# Patient Record
Sex: Male | Born: 1945 | Race: Black or African American | Hispanic: No | Smoking: Current every day smoker
Health system: Southern US, Community
[De-identification: ages and names within clinical notes are randomized; demographics above are authoritative.]

## PROBLEM LIST (undated history)

## (undated) DIAGNOSIS — K7460 Unspecified cirrhosis of liver: Secondary | ICD-10-CM

## (undated) DIAGNOSIS — J45909 Unspecified asthma, uncomplicated: Secondary | ICD-10-CM

## (undated) DIAGNOSIS — C229 Malignant neoplasm of liver, not specified as primary or secondary: Secondary | ICD-10-CM

## (undated) DIAGNOSIS — J449 Chronic obstructive pulmonary disease, unspecified: Secondary | ICD-10-CM

## (undated) HISTORY — DX: Unspecified cirrhosis of liver: K74.60

## (undated) HISTORY — DX: Malignant neoplasm of liver, not specified as primary or secondary: C22.9

## (undated) HISTORY — PX: ANKLE SURGERY: SHX546

## (undated) HISTORY — DX: Unspecified asthma, uncomplicated: J45.909

---

## 2007-10-16 ENCOUNTER — Emergency Department: Payer: Self-pay | Admitting: Emergency Medicine

## 2008-09-02 ENCOUNTER — Observation Stay: Payer: Self-pay | Admitting: Internal Medicine

## 2008-12-01 ENCOUNTER — Inpatient Hospital Stay: Payer: Self-pay | Admitting: Internal Medicine

## 2011-01-27 ENCOUNTER — Inpatient Hospital Stay: Payer: Self-pay | Admitting: Internal Medicine

## 2013-06-30 ENCOUNTER — Emergency Department: Payer: Self-pay | Admitting: Emergency Medicine

## 2013-06-30 LAB — BASIC METABOLIC PANEL
Chloride: 105 mmol/L (ref 98–107)
Co2: 28 mmol/L (ref 21–32)
Creatinine: 1.04 mg/dL (ref 0.60–1.30)
EGFR (African American): >60
EGFR (Non-African Amer.): >60
Potassium: 4.3 mmol/L (ref 3.5–5.1)
Sodium: 137 mmol/L (ref 136–145)

## 2013-06-30 LAB — CBC
HGB: 13.4 g/dL (ref 13.0–18.0)
MCH: 35.8 pg — ABNORMAL HIGH (ref 26.0–34.0)
RDW: 14.8 % — ABNORMAL HIGH (ref 11.5–14.5)

## 2013-06-30 LAB — TROPONIN I: Troponin-I: <0.02 ng/mL

## 2013-12-19 ENCOUNTER — Inpatient Hospital Stay: Payer: Self-pay | Admitting: Internal Medicine

## 2013-12-19 LAB — CBC
HCT: 42.2 % (ref 40.0–52.0)
HGB: 13.9 g/dL (ref 13.0–18.0)
MCH: 34.1 pg — ABNORMAL HIGH (ref 26.0–34.0)
MCHC: 33 g/dL (ref 32.0–36.0)
MCV: 103 fL — AB (ref 80–100)
Platelet: 127 10*3/uL — ABNORMAL LOW (ref 150–440)
RBC: 4.08 10*6/uL — ABNORMAL LOW (ref 4.40–5.90)
RDW: 14.6 % — AB (ref 11.5–14.5)
WBC: 4.4 10*3/uL (ref 3.8–10.6)

## 2013-12-19 LAB — COMPREHENSIVE METABOLIC PANEL
ALBUMIN: 2.9 g/dL — AB (ref 3.4–5.0)
AST: 130 U/L — AB (ref 15–37)
Alkaline Phosphatase: 237 U/L — ABNORMAL HIGH
Anion Gap: 5 — ABNORMAL LOW (ref 7–16)
BUN: 13 mg/dL (ref 7–18)
Bilirubin,Total: 0.6 mg/dL (ref 0.2–1.0)
CHLORIDE: 101 mmol/L (ref 98–107)
CO2: 30 mmol/L (ref 21–32)
Calcium, Total: 8.9 mg/dL (ref 8.5–10.1)
Creatinine: 0.94 mg/dL (ref 0.60–1.30)
EGFR (African American): >60
Glucose: 98 mg/dL (ref 65–99)
Osmolality: 272 (ref 275–301)
POTASSIUM: 4.7 mmol/L (ref 3.5–5.1)
SGPT (ALT): 110 U/L — ABNORMAL HIGH (ref 12–78)
Sodium: 136 mmol/L (ref 136–145)
Total Protein: 8.1 g/dL (ref 6.4–8.2)

## 2013-12-19 LAB — DRUG SCREEN, URINE
Amphetamines, Ur Screen: NEGATIVE (ref ?–1000)
Barbiturates, Ur Screen: NEGATIVE (ref ?–200)
Benzodiazepine, Ur Scrn: NEGATIVE (ref ?–200)
CANNABINOID 50 NG, UR ~~LOC~~: NEGATIVE (ref ?–50)
COCAINE METABOLITE, UR ~~LOC~~: POSITIVE (ref ?–300)
MDMA (Ecstasy)Ur Screen: NEGATIVE (ref ?–500)
METHADONE, UR SCREEN: NEGATIVE (ref ?–300)
Opiate, Ur Screen: NEGATIVE (ref ?–300)
Phencyclidine (PCP) Ur S: NEGATIVE (ref ?–25)
Tricyclic, Ur Screen: NEGATIVE (ref ?–1000)

## 2013-12-19 LAB — D-DIMER(ARMC): D-DIMER: 318 ng/mL

## 2013-12-19 LAB — ETHANOL
Ethanol %: <0.003 % (ref 0.000–0.080)
Ethanol: <3 mg/dL

## 2013-12-19 LAB — PRO B NATRIURETIC PEPTIDE: B-Type Natriuretic Peptide: 114 pg/mL (ref 0–125)

## 2013-12-19 LAB — TROPONIN I

## 2013-12-20 LAB — CBC WITH DIFFERENTIAL/PLATELET
BASOS PCT: 0.2 %
Basophil #: 0 10*3/uL (ref 0.0–0.1)
EOS ABS: 0 10*3/uL (ref 0.0–0.7)
Eosinophil %: 0 %
HCT: 38.1 % — ABNORMAL LOW (ref 40.0–52.0)
HGB: 12.7 g/dL — ABNORMAL LOW (ref 13.0–18.0)
Lymphocyte #: 1 10*3/uL (ref 1.0–3.6)
Lymphocyte %: 14.7 %
MCH: 34.7 pg — ABNORMAL HIGH (ref 26.0–34.0)
MCHC: 33.5 g/dL (ref 32.0–36.0)
MCV: 104 fL — ABNORMAL HIGH (ref 80–100)
MONO ABS: 0.1 x10 3/mm — AB (ref 0.2–1.0)
Monocyte %: 1.2 %
NEUTROS PCT: 83.9 %
Neutrophil #: 5.4 10*3/uL (ref 1.4–6.5)
Platelet: 126 10*3/uL — ABNORMAL LOW (ref 150–440)
RBC: 3.67 10*6/uL — ABNORMAL LOW (ref 4.40–5.90)
RDW: 14.4 % (ref 11.5–14.5)
WBC: 6.5 10*3/uL (ref 3.8–10.6)

## 2013-12-20 LAB — BASIC METABOLIC PANEL
Anion Gap: 5 — ABNORMAL LOW (ref 7–16)
BUN: 20 mg/dL — ABNORMAL HIGH (ref 7–18)
CO2: 27 mmol/L (ref 21–32)
CREATININE: 1.02 mg/dL (ref 0.60–1.30)
Calcium, Total: 8.5 mg/dL (ref 8.5–10.1)
Chloride: 100 mmol/L (ref 98–107)
EGFR (African American): >60
Glucose: 145 mg/dL — ABNORMAL HIGH (ref 65–99)
OSMOLALITY: 270 (ref 275–301)
Potassium: 4.2 mmol/L (ref 3.5–5.1)
Sodium: 132 mmol/L — ABNORMAL LOW (ref 136–145)

## 2013-12-20 LAB — HEPATIC FUNCTION PANEL A (ARMC)
Albumin: 2.8 g/dL — ABNORMAL LOW (ref 3.4–5.0)
Alkaline Phosphatase: 188 U/L — ABNORMAL HIGH
Bilirubin, Direct: 0.3 mg/dL — ABNORMAL HIGH (ref 0.00–0.20)
Bilirubin,Total: 0.5 mg/dL (ref 0.2–1.0)
SGOT(AST): 110 U/L — ABNORMAL HIGH (ref 15–37)
SGPT (ALT): 106 U/L — ABNORMAL HIGH (ref 12–78)
Total Protein: 7.6 g/dL (ref 6.4–8.2)

## 2014-06-09 ENCOUNTER — Emergency Department: Payer: Self-pay | Admitting: Emergency Medicine

## 2014-06-09 LAB — CBC
HCT: 42.4 % (ref 40.0–52.0)
HGB: 13.9 g/dL (ref 13.0–18.0)
MCH: 33.9 pg (ref 26.0–34.0)
MCHC: 32.8 g/dL (ref 32.0–36.0)
MCV: 103 fL — AB (ref 80–100)
Platelet: 105 10*3/uL — ABNORMAL LOW (ref 150–440)
RBC: 4.1 10*6/uL — ABNORMAL LOW (ref 4.40–5.90)
RDW: 13.5 % (ref 11.5–14.5)
WBC: 4.6 10*3/uL (ref 3.8–10.6)

## 2014-06-09 LAB — COMPREHENSIVE METABOLIC PANEL
ALBUMIN: 2.9 g/dL — AB (ref 3.4–5.0)
ALK PHOS: 152 U/L — AB
Anion Gap: 7 (ref 7–16)
BILIRUBIN TOTAL: 1.2 mg/dL — AB (ref 0.2–1.0)
BUN: 13 mg/dL (ref 7–18)
CALCIUM: 8.2 mg/dL — AB (ref 8.5–10.1)
CHLORIDE: 102 mmol/L (ref 98–107)
CREATININE: 0.99 mg/dL (ref 0.60–1.30)
Co2: 25 mmol/L (ref 21–32)
EGFR (Non-African Amer.): >60
GLUCOSE: 114 mg/dL — AB (ref 65–99)
Osmolality: 269 (ref 275–301)
POTASSIUM: 4 mmol/L (ref 3.5–5.1)
SGOT(AST): 98 U/L — ABNORMAL HIGH (ref 15–37)
SGPT (ALT): 109 U/L — ABNORMAL HIGH
SODIUM: 134 mmol/L — AB (ref 136–145)
TOTAL PROTEIN: 7.5 g/dL (ref 6.4–8.2)

## 2014-06-09 LAB — TROPONIN I: Troponin-I: <0.02 ng/mL

## 2014-06-09 LAB — LIPASE, BLOOD: Lipase: 182 U/L (ref 73–393)

## 2015-01-06 NOTE — H&P (Signed)
PATIENT NAME:  Richard Wood, Richard Wood MR#:  [id number] DATE OF BIRTH:  Apr 16, 1946  DATE OF ADMISSION:  12/19/2013  PRIMARY CARE PHYSICIAN: Lorelee Market, MD  CHIEF COMPLAINT: Shortness of breath.   HISTORY OF PRESENT ILLNESS: This is a 69 year old male who presents to the hospital due to worsening shortness of breath and cough for the past 4 to 5 days. The patient says his shortness of breath has gotten so progressively worse that he has it even on rest now. He admits to a cough, but it is not that productive. Admits to chills but no documented fever. He denies any nausea, vomiting, abdominal pain, chest pain or any other associated symptoms. The patient said he saw Dr. Brunetta Genera last week who prescribed him an oral antibiotic. He has been taking but his symptoms have not improved. He presented to the hospital, was noted to have diffuse wheezing and shortness of breath and noted to be in COPD exacerbation. Hospitalist services were contacted for further treatment and evaluation.   REVIEW OF SYSTEMS:  CONSTITUTIONAL: No documented fever. No weight gain. No weight loss.  EYES: No blurry or double vision.  ENT: No tinnitus or postnasal drip. No redness of the oropharynx.  RESPIRATORY: Positive cough. Positive wheeze. No hemoptysis. Positive dyspnea and COPD.  CARDIOVASCULAR: No chest pain, no orthopnea, no palpitations, and no syncope.  GASTROINTESTINAL: No nausea, no vomiting, no diarrhea, no abdominal pain, no melena or hematochezia.  GENITOURINARY: No dysuria or hematuria.  ENDOCRINE: No polyuria or nocturia. No heat or cold intolerance.  HEMATOLOGIC: No anemia, no bruising, and no bleeding.  INTEGUMENTARY: No rashes and no lesions.  MUSCULOSKELETAL: No arthritis, no swelling, and no gout.  NEUROLOGIC: No numbness or tingling. No ataxia. No seizure-type activity.  PSYCHIATRIC: Positive anxiety. No insomnia. No ADD.   PAST MEDICAL HISTORY: Consistent with COPD, hepatitis C, tobacco abuse, cocaine  abuse, and anxiety.   ALLERGIES: No known drug allergies.   SOCIAL HISTORY: Used to be a smoker. Does have a 30 to 40 pack-year smoking history, quit 3 weeks ago. Also, he used to drink a 6 pack weekly, quit about 3 weeks ago. He also did cocaine about a month ago.   FAMILY HISTORY: Mother and father are both deceased. Mother died from cancer, likely pancreatic or colon cancer. Father died from old age.   CURRENT MEDICATIONS: Albuterol, DuoNebs q. 6 hours, albuterol inhaler 2 puffs 4 times daily as needed, Xanax 0.5 mg b.i.d., amoxicillin 875 mg b.i.d., formoterol 12 mcg inhalation capsule b.i.d., mometasone spray 1 puff daily, Ambien 5 mg at bedtime.   PHYSICAL EXAMINATION:  VITAL SIGNS: Presently, temperature is 98.4, pulse 96, respirations 18, blood pressure 157/98 and sats 99% on room air.  GENERAL: He is a pleasant-appearing male in mild respiratory distress. HEAD, EYES, NOSE, AND THROAT: He is atraumatic, normocephalic. Extraocular muscles are intact. Pupils are equal and reactive to light. Sclerae anicteric. No conjunctival injection. No pharyngeal erythema.  NECK: Supple. There is no jugular venous distention. No bruits, no lymphadenopathy and no thyromegaly.  HEART: Regular rate and rhythm. No murmurs. No rubs. No clicks.  LUNGS: Diffuse wheezing end-expiratory bilaterally. Negative use of accessory muscles. No dullness to percussion.  ABDOMEN: Soft, flat, nontender, nondistended. Has good bowel sounds. No hepatosplenomegaly appreciated.  EXTREMITIES: No evidence of any cyanosis, clubbing, or peripheral edema. Has +2 pedal and radial pulses bilaterally. NEUROLOGIC: The patient is alert, awake, and oriented x3 with no focal motor or sensory deficits appreciated bilaterally.  SKIN: Moist and  warm with no rashes appreciated.  LYMPHATIC: There is no cervical or axillary lymphadenopathy.   DIAGNOSTIC DATA: Laboratory: Serum glucose 98, BUN 13, creatinine 0.9, sodium 136, potassium 4.7,  chloride 101, bicarb 30. the patient's LFTs showed alk phos of 237, AST 130, ALT 110. Troponin less than 0.02. White cell count is 4.4, hemoglobin 13.9, hematocrit 42.2, platelet count 127,000.   The patient did have a chest x-ray done which showed COPD with no acute cardiopulmonary disease.   ASSESSMENT AND PLAN: This is a 69 year old male with a history of chronic obstructive pulmonary disease, hepatitis C, tobacco and cocaine abuse, and anxiety who presents to the hospital with shortness of breath, cough, progressively getting worse and noted to be in chronic obstructive pulmonary disease exacerbation. 1.  Chronic obstructive pulmonary disease exacerbation. This is likely secondary to possible underlying acute bronchitis. There is no evidence of pneumonia on chest x-ray. The patient quit smoking 3 weeks ago. He has failed outpatient therapy with oral antibiotics. I will start the patient on IV steroids, around-the-clock nebulizer treatments, add some Advair and Spiriva and follow. I will also start him on empiric Z-Pak for now, assess him for home oxygen prior to discharge.  2.  Abnormal liver function tests. This is likely secondary to his underlying hepatitis C. We will follow his liver function tests. 3.  Thrombocytopenia. This is mild, also possibly related to underlying hepatitis C. No evidence of acute bleeding. Follow platelet count. Hold heparin products.  4.  Anxiety. Continue Xanax.  5.  Polysubstance abuse. I will check a urine drug screen.   CODE STATUS: FULL.  TIME SPENT: 50 minutes.  ____________________________ Belia Heman. Verdell Carmine, MD vjs:[id number] D: 12/19/2013 13:40:32 ET T: 12/19/2013 14:08:56 ET JOB#: [id number]  cc: Belia Heman. Verdell Carmine, MD, <Dictator> Henreitta Leber MD ELECTRONICALLY SIGNED 12/25/2013 18:48

## 2015-01-06 NOTE — Discharge Summary (Signed)
PATIENT NAME:  Richard Wood, Richard Wood MR#:  [id number] DATE OF BIRTH:  1946/05/19  DATE OF ADMISSION:  12/19/2013 DATE OF DISCHARGE:  12/21/2013  ADMITTING PHYSICIAN: Belia Heman. Verdell Carmine, MD  DISCHARGING PHYSICIAN: Gladstone Lighter, MD  PRIMARY CARE PHYSICIAN: Meindert A. Brunetta Genera, MD  DISCHARGE DIAGNOSES: 1.  Acute on chronic, chronic obstructive pulmonary disease exacerbation.  2.  Anxiety.  3.  Mild thrombocytopenia.  4.  Hepatitis C and liver cirrhosis.   DISCHARGE HOME MEDICATIONS:  1.  Albuterol nebulizer 3 mL every 6 hours p.r.n. for wheezing.  2.  Albuterol inhaler 2 puffs q.6 hours p.r.n.  3.  Mometasone 220 mcg per inhalation 1 puff once a day.  4.  Ambien 5 mg p.o. at bedtime.  5.  Formoterol 12 mcg 1 capsule inhalation every 12 hours.  6.  Xanax 0.5 mg p.o. b.i.d.  7.  Prednisone taper over 12 days. 8.  Spiriva inhalation capsule daily. 9.  Robitussin AC 5 mL q.6 hours p.r.n. for cough.  10.  Azithromycin 250 mg p.o. daily for 3 more days.   DISCHARGE HOME OXYGEN: None.   DISCHARGE DIET: Low-sodium diet.   DISCHARGE ACTIVITY: As tolerated.    FOLLOWUP INSTRUCTIONS: PCP followup in 1 to 2 weeks.   LABORATORY AND IMAGING STUDIES PRIOR TO DISCHARGE: WBC count is 6.5, hemoglobin 12.7, hematocrit 38.1; platelet count is 126.   Sodium 132, potassium 4.2, chloride 100, bicarbonate 27, BUN 20, creatinine 1.02, glucose 145, and calcium of 8.5.   ALT 106, AST 110, alkaline phosphatase 188, total bilirubin 0.5, and albumin of 3.8. Urine tox screen positive for cocaine. Troponins negative in the hospital through the hospital stay. Chest x-ray on admission showing no acute cardiopulmonary disease, evidence of COPD seen.   BRIEF HOSPITAL COURSE: Mr. Vayda is a 69 year old African American male with past medical history significant for COPD, not on any home oxygen, smoker and takes cocaine once in a while. Presented to the hospital secondary to difficulty breathing and noted to have COPD  exacerbation with mild bronchitis.  1.  Acute on chronic COPD exacerbation with bronchitis: Started on steroids. Nebulizers and inhalers were continued and also Z-Pak. Symptoms improved. He was on oxygen when he came in. His chest x-ray was clear. Now he is on room air, ambulating well. Breathing is controlled. He is being discharged on p.o. prednisone and Z-Pak continuation. 2.  Hepatitis C and liver cirrhosis with abnormal liver function tests, worsened on admission; however, repeat tests have shown some improvement. He also has chronic thrombocytopenia secondary to the same and will need outpatient monitoring.  3.  Anxiety: Xanax was continued.   His course has been otherwise uneventful in the hospital.   DISCHARGE CONDITION: Stable.   DISCHARGE DISPOSITION: Home.   TIME SPENT ON DISCHARGE: 40 minutes.    ____________________________ Gladstone Lighter, MD rk:jcm D: 12/21/2013 14:02:38 ET T: 12/21/2013 14:48:56 ET JOB#: [id number]  cc: Gladstone Lighter, MD, <Dictator> Meindert A. Brunetta Genera, MD Gladstone Lighter MD ELECTRONICALLY SIGNED 01/03/2014 11:19

## 2015-11-06 DIAGNOSIS — C220 Liver cell carcinoma: Secondary | ICD-10-CM | POA: Insufficient documentation

## 2015-11-14 HISTORY — PX: LIVER SURGERY: SHX698

## 2016-08-04 ENCOUNTER — Encounter: Payer: Self-pay | Admitting: Emergency Medicine

## 2016-08-04 ENCOUNTER — Emergency Department
Admission: EM | Admit: 2016-08-04 | Discharge: 2016-08-04 | Disposition: A | Discharge status: Home / Self Care | Payer: Medicare Other | Attending: Emergency Medicine | Admitting: Emergency Medicine

## 2016-08-04 DIAGNOSIS — K4091 Unilateral inguinal hernia, without obstruction or gangrene, recurrent: Secondary | ICD-10-CM | POA: Diagnosis not present

## 2016-08-04 DIAGNOSIS — R1032 Left lower quadrant pain: Secondary | ICD-10-CM | POA: Diagnosis present

## 2016-08-04 DIAGNOSIS — J449 Chronic obstructive pulmonary disease, unspecified: Secondary | ICD-10-CM | POA: Diagnosis not present

## 2016-08-04 HISTORY — DX: Chronic obstructive pulmonary disease, unspecified: J44.9

## 2016-08-04 MED ORDER — OXYCODONE HCL 5 MG PO TABS: 5.0000 mg | ORAL_TABLET | Freq: Three times a day (TID) | ORAL | 0 refills | Status: DC | PRN

## 2016-08-04 NOTE — Discharge Instructions (Signed)
As we discussed please call the number provider to arrange a follow-up appointment with general surgery since possible. Take your pain medication as needed, as prescribed. If the hernia/swelling returns and you're not able to push it back in or it becomes painful or you develop a fever or you need to come back to the emergency department immediately for evaluation. Otherwise follow up with general surgery.

## 2016-08-04 NOTE — ED Triage Notes (Addendum)
Pt reports intermittent left side groin pain and swelling for approximately one month. Pt denies NVD. Pt denies dysuria or difficulty voiding. Pt in no apparent distress in triage. Ambulated without difficulty.

## 2016-08-04 NOTE — ED Provider Notes (Addendum)
[facility] Emergency Department Provider Note  Time seen: 3:09 PM  I have reviewed the triage vital signs and the nursing notes.   HISTORY  Chief Complaint Groin Pain    HPI Richard Wood is a 70 y.o. male with a past medical history of COPD who presents the emergency department with left groin pain. According to the patientfor the past several months he has been experiencing swelling to the left groin which occurs intermittently. States he can push on it causes swelling to go back down. He states this morning he was having a lot of pain with the swelling so he came to the emergency department. However once arriving in the emergency department he noted that the swelling had gone back down and his pain was once again gone. Denies any constipation, states daily bowel movements including today. Denies abdominal pain at this point. Denies fever. Denies dysuria or hematuria. Denies nausea or vomiting.  Past Medical History:  Diagnosis Date  . COPD (chronic obstructive pulmonary disease) (HCC)     There are no active problems to display for this patient.   No past surgical history on file.  Prior to Admission medications   Not on File    Allergies  Allergen Reactions  . Influenza Vaccine Recombinant Hives    No family history on file.  Social History Social History  Substance Use Topics  . Smoking status: Not on file  . Smokeless tobacco: Not on file  . Alcohol use Not on file    Review of Systems Constitutional: Negative for fever. Cardiovascular: Negative for chest pain. Respiratory: Negative for shortness of breath. Gastrointestinal: Intermittent left groin pain Genitourinary: Negative for dysuria. 10-point ROS otherwise negative.  ____________________________________________   PHYSICAL EXAM:  VITAL SIGNS: ED Triage Vitals [08/04/16 1146]  Enc Vitals Group     BP 126/70     Pulse Rate 89     Resp 18     Temp 97.8 F (36.6 C)   Temp Source Oral     SpO2 99 %     Weight 120 lb (54.4 kg)     Height 5\' 10"  (1.778 m)     Head Circumference      Peak Flow      Pain Score 8     Pain Loc      Pain Edu?      Excl. in [place]?     Constitutional: Alert and oriented. Well appearing and in no distress. Eyes: Normal exam ENT   Head: Normocephalic and atraumatic.   Mouth/Throat: Mucous membranes are moist. Cardiovascular: Normal rate, regular rhythm. No murmur Respiratory: Normal respiratory effort without tachypnea nor retractions. Breath sounds are clear Gastrointestinal: Soft, no tenderness. No distention. No mass. Normal GU exam. Nontender testicles. No obvious hernia at this time. Musculoskeletal: Nontender with normal range of motion in all extremities. Neurologic:  Normal speech and language. No gross focal neurologic deficits  Skin:  Skin is warm, dry and intact.  Psychiatric: Mood and affect are normal. Speech and behavior are normal.   ____________________________________________   INITIAL IMPRESSION / ASSESSMENT AND PLAN / ED COURSE  Pertinent labs & imaging results that were available during my care of the patient were reviewed by me and considered in my medical decision making (see chart for details).  The patient presents the emergency department with intermittent left groin pain over the past 1-2 months. He states he will occasionally have pain and swelling in the left groin. States he is  able to lie down flat or push on the area and the swelling and pain will go away. Patient's symptoms are very consistent with an inguinal hernia. The location of the discomfort is just over his inguinal canal. Highly suspect intermittent hernia. However the patient is currently pain-free with a normal GU exam. No other concerning associated findings. I discussed with the patient follow-up with Gen. surgery clinic. We'll discharge with short course of pain medication to be used as needed. I also discussed return  precautions for pain over the hernia, or not being able to push the hernia back in. Patient is agreeable plan.  ____________________________________________   FINAL CLINICAL IMPRESSION(S) / ED DIAGNOSES  Inguinal hernia    Harvest Dark, MD 08/04/16 1512    Harvest Dark, MD 08/04/16 [phone]

## 2016-08-15 ENCOUNTER — Ambulatory Visit: Payer: Medicare Other | Admitting: Surgery

## 2016-08-28 ENCOUNTER — Other Ambulatory Visit
Admission: RE | Admit: 2016-08-28 | Discharge: 2016-08-28 | Disposition: A | Discharge status: Home / Self Care | Payer: Medicare Other | Source: Ambulatory Visit | Attending: Surgery | Admitting: Surgery

## 2016-08-28 ENCOUNTER — Ambulatory Visit (INDEPENDENT_AMBULATORY_CARE_PROVIDER_SITE_OTHER): Payer: Medicare Other | Admitting: Surgery

## 2016-08-28 ENCOUNTER — Telehealth: Payer: Self-pay

## 2016-08-28 ENCOUNTER — Encounter: Payer: Self-pay | Admitting: Surgery

## 2016-08-28 VITALS — BP 138/87 | HR 86 | Temp 98.5°F | Ht 70.0 in | Wt 117.2 lb

## 2016-08-28 DIAGNOSIS — K4090 Unilateral inguinal hernia, without obstruction or gangrene, not specified as recurrent: Secondary | ICD-10-CM | POA: Diagnosis not present

## 2016-08-28 DIAGNOSIS — Z01818 Encounter for other preprocedural examination: Secondary | ICD-10-CM

## 2016-08-28 DIAGNOSIS — C220 Liver cell carcinoma: Secondary | ICD-10-CM | POA: Diagnosis not present

## 2016-08-28 MED ORDER — GABAPENTIN 300 MG PO CAPS: 300.0000 mg | ORAL_CAPSULE | Freq: Three times a day (TID) | ORAL | 0 refills | Status: DC

## 2016-08-28 NOTE — Progress Notes (Signed)
Patient ID: Richard Wood, male   DOB: 09-Mar-1946, 70 y.o.   MRN: [id number]  HPI Richard Wood is a 70 y.o. male seen in consultation for a left inguinal hernia. He reports that he does have excruciating pain on the left inguinal region for the last 3 months. Pain is intermittent and is getting worse and is to the point of being excruciating. He has still bulging sensation that he is able to reduce. He discussed the pain as severe, sharp and worsening with some Valsalva maneuvers. Of note he does have a history of hepatocellular carcinoma and usually goes to the [place]. Unfortunately not all the records are available but looking at [place] looks like he did have an embolization with radioactive material performed at [facility]. The patient reports that he has not had any follow-up at the [facility]. And there is a questionable cirrhosis and. He did use alcohol but the last time he drank was about a year ago. He does use some cocaine MR 10 occasionally. Last time he had any illegal substance abuse was about 2 weeks ago. He is able to walk without difficulty and no shortness of breath or chest pain. No evidence of overt cirrhosis. I have reviewed the ultrasound personally that was done 2015 and there was no evidence of cirrhosis or liver masses. He did have some sludge in the common bile duct and GB.  HPI  Past Medical History:  Diagnosis Date  . Asthma   . Cirrhosis ([place])   . COPD (chronic obstructive pulmonary disease) ([place])   . Liver cancer [facility])     Past Surgical History:  Procedure Laterality Date  . ANKLE SURGERY Left 1980's  . LIVER SURGERY  11/2015   [facility]    Family History  Problem Relation Age of Onset  . Heart disease Father     Social History Social History  Substance Use Topics  . Smoking status: Current Every Day Smoker    Packs/day: 0.25    Types: Cigarettes  . Smokeless tobacco: Never Used  . Alcohol use No     Comment: Last Drink- 09/2015 - Prior Heavy ETOH  Use    Allergies  Allergen Reactions  . Influenza Vaccine Recombinant Hives  . Other Hives    Current Outpatient Prescriptions  Medication Sig Dispense Refill  . ibuprofen (ADVIL,MOTRIN) 200 MG tablet Take 200 mg by mouth every 6 (six) hours as needed.    . Naproxen Sodium (ALEVE) 220 MG CAPS Take 2 capsules by mouth every 8 (eight) hours as needed.    . gabapentin (NEURONTIN) 300 MG capsule Take 1 capsule (300 mg total) by mouth 3 (three) times daily. 90 capsule 0   No current facility-administered medications for this visit.      Review of Systems A 10 point review of systems was asked and was negative except for the information on the HPI  Physical Exam Blood pressure 138/87, pulse 86, temperature 98.5 F (36.9 C), temperature source Oral, height 5\' 10"  (1.778 m), weight 53.2 kg (117 lb 3.2 oz). CONSTITUTIONAL: NAD EYES: Pupils are equal, round, and reactive to light, Sclera are non-icteric. EARS, NOSE, MOUTH AND THROAT: The oropharynx is clear. The oral mucosa is pink and moist. Hearing is intact to voice. LYMPH NODES:  Lymph nodes in the neck are normal. RESPIRATORY:  Lungs are clear. There is normal respiratory effort, with equal breath sounds bilaterally, and without pathologic use of accessory muscles. CARDIOVASCULAR: Heart is regular without murmurs, gallops, or  rubs. GI: The abdomen is  soft, no evidence of ascites or caput medusa, there is a reducible but tender left inguinal hernia. There is no peritonitis. No evidence on physical exam of cirrhosis   GU: Rectal deferred.   MUSCULOSKELETAL: Normal muscle strength and tone. No cyanosis or edema.   SKIN: Turgor is good and there are no pathologic skin lesions or ulcers. NEUROLOGIC: Motor and sensation is grossly normal. Cranial nerves are grossly intact. PSYCH:  Oriented to person, place and time. Affect is normal.  Data Reviewed I have personally reviewed the patient's imaging, laboratory findings and medical  records.    Assessment Plan 70 year old male with a symptomatic left inguinal hernia on a patient with a history of hepatocellular carcinoma with recent embolization of radiation seeds. Unfortunately I do not have any base and about his liver function and about the status of his hepatocellular carcinoma. He goes to the [place] and will have to either obtain records from the [place]. In the meantime we will go ahead and assess his liver function with a CMP, coags and an ultrasound of the right upper quadrant to see if there is any ascites or cirrhosis. And no need for emergent surgical intervention as I stated before we have to first-degree out what is the status of his liver function before we commit for any surgical intervention. Discussed with the patient in detail. I will help him with gabapentin for his irritation of the ilioinguinal nerve. I didn't talk to him about not using narcotics at this stage. We will see him in a couple weeks once we have a better idea of his liver function. Extensive counseling provided  Caroleen Hamman, MD FACS General Surgeon 08/28/2016, 10:07 AM

## 2016-08-28 NOTE — Telephone Encounter (Signed)
Spoke with patient at this time regarding a message left for him to call our office. Patient was told of the Ultrasound appointment on 09/04/16 at 8:15 am. He stated he did not go for lab work today due to having to pick up his grandchild. We discussed having lab work done tomorrow and he said he would do so. Appointment reminder is mailed to patient at this time.

## 2016-08-28 NOTE — Patient Instructions (Signed)
We will call the [facility] and get you and appointment with the Cancer doctor there for evaluation. This HAS to be done prior to doing your surgery.  We will get labs drawn today. Go over to the medical mall at the hospital to have these done. I will call you with the results.  Directions to Medical Mall: When leaving our office, go right. Go all of the way down to the very end of the hallway. You will have a purple wall in front of you. You will now have a tunnel to the hospital on your left hand side. Go through this tunnel and the elevators will be on your left. Go down to the 1st floor and take a slight left. The very first desk on the right hand side is the registration desk.  Please pick up your pain medication at your [facility].  We have scheduled you for an Ultrasound of your Abdomen. We will call you with the details of this appointment as soon as this is scheduled. Bring a list of medications with you to your appointment. Nothing to eat or drink after midnight prior to your Ultrasound.  If you need to reschedule your Ultrasound, you may do so by calling [phone].

## 2016-09-01 ENCOUNTER — Telehealth: Payer: Self-pay

## 2016-09-01 NOTE — Telephone Encounter (Signed)
Asked Angie to please schedule an appointment for patient to see his Oncologist. Janace Hoard was told that patient has an appointment scheduled to be seen by his Oncologist on 09/23/2016. Patient will see Dr. Dahlia Byes on 10/01/2016 to discuss about his Oncologist appointment and possible scheduling surgery.  Angie is requesting patient's records from his Oncologist for Dr. Dahlia Byes to review.

## 2016-09-04 ENCOUNTER — Ambulatory Visit: Payer: Medicare Other

## 2016-09-17 ENCOUNTER — Ambulatory Visit
Admission: RE | Admit: 2016-09-17 | Discharge: 2016-09-17 | Disposition: A | Discharge status: Home / Self Care | Payer: Medicare Other | Source: Ambulatory Visit | Attending: Surgery | Admitting: Surgery

## 2016-09-17 DIAGNOSIS — K8020 Calculus of gallbladder without cholecystitis without obstruction: Secondary | ICD-10-CM | POA: Diagnosis not present

## 2016-09-17 DIAGNOSIS — K7460 Unspecified cirrhosis of liver: Secondary | ICD-10-CM | POA: Diagnosis present

## 2016-09-17 DIAGNOSIS — C220 Liver cell carcinoma: Secondary | ICD-10-CM | POA: Diagnosis not present

## 2016-09-17 DIAGNOSIS — I700 Atherosclerosis of aorta: Secondary | ICD-10-CM | POA: Insufficient documentation

## 2016-09-22 ENCOUNTER — Telehealth: Payer: Self-pay

## 2016-09-22 NOTE — Telephone Encounter (Signed)
Patient is calling to get his ultrasound results. He is very uncomfortable right now and his stomach is really bloated. Please call patient and advice.

## 2016-09-22 NOTE — Telephone Encounter (Signed)
Called patient on both numbers listed. The mobile number is missing. The home number continues to ring. Unable to leave a message at either number.  If patient returns phone call, the results of Ultrasound are complicated and will be reviewed with patient at 10/01/16 with Dr. Dahlia Byes.

## 2016-09-23 ENCOUNTER — Telehealth: Payer: Self-pay | Admitting: Surgery

## 2016-09-23 NOTE — Telephone Encounter (Signed)
Patient has called back for results of Ultrasound. I have advised the patient that the results will be discussed at his upcoming appointment with Dr Dahlia Byes on 10/01/16. Patient was understanding of this.   Patient complains of severe abdominal pain and bloating since 08/04/16.  He has been taking Aleve and Motrin daily. The gabapentin causes the patient to have nightmares.   Please advise patient at [phone].

## 2016-09-23 NOTE — Telephone Encounter (Signed)
Patient returned phone call and states that he is having severe abdominal pain and cramping. His last Bowel Movement was on 09/21/16 and was very small. Does not remember when last good bowel movement was and is having to strain significantly to move his bowels. Instructed patient to drink 1 entire bottle of Magnesium Citrate and if no bowel movement in 4 hours to do 2 fleets enemas back to back. Patient wrote down all instructions and will go to [facility] at this time to get medications. I instructed him to stop use of all NSAIDS. He verbalizes understanding and will call back tomorrow if he has not had a bowel movement or is not feeling any better.

## 2016-09-23 NOTE — Telephone Encounter (Signed)
I have returned phone call to patient. Home number continues to ring and mobile number is disconnected. Would be glad to speak with patient if patient returns phone call.

## 2016-09-25 ENCOUNTER — Telehealth: Payer: Self-pay

## 2016-09-25 NOTE — Telephone Encounter (Signed)
Patient is still bloated. He said his hernia has been sticking out and is in a lot of pain. Please call patient and advice. (Previous telephone notes are in the encounter)

## 2016-09-25 NOTE — Telephone Encounter (Signed)
Returned phone call to patient at this time. He states that he is having to strain with bowel movements which is making hernia bulge out more causing more pain. I explained to patient that he needs to be able to have bowel movements without straining to decrease the stress on this hernia. He may use Miralax 17g 1-2 times daily to help with this process. He verbalizes understanding of this and will call back if this does not help.  Also, he states that he is still losing weight and is confused over his upcoming appointments at the [place]. He does not know who he is seeing and why. He is to see an Materials engineer at the [place] in regards to his Hepatocellular Carcinoma but I do not know when this is. I will get to the bottom of this and return phone call to patient with this information.

## 2016-10-01 ENCOUNTER — Ambulatory Visit: Payer: Medicare Other | Admitting: Surgery

## 2016-10-03 ENCOUNTER — Ambulatory Visit (INDEPENDENT_AMBULATORY_CARE_PROVIDER_SITE_OTHER): Payer: Medicare Other | Admitting: Surgery

## 2016-10-03 ENCOUNTER — Other Ambulatory Visit
Admission: RE | Admit: 2016-10-03 | Discharge: 2016-10-03 | Disposition: A | Discharge status: Home / Self Care | Payer: Medicare Other | Source: Ambulatory Visit | Attending: Surgery | Admitting: Surgery

## 2016-10-03 ENCOUNTER — Encounter: Payer: Self-pay | Admitting: Surgery

## 2016-10-03 VITALS — BP 167/113 | HR 94 | Temp 97.8°F | Ht 70.0 in | Wt 122.0 lb

## 2016-10-03 DIAGNOSIS — K745 Biliary cirrhosis, unspecified: Secondary | ICD-10-CM | POA: Diagnosis not present

## 2016-10-03 DIAGNOSIS — C220 Liver cell carcinoma: Secondary | ICD-10-CM | POA: Diagnosis present

## 2016-10-03 LAB — CBC WITH DIFFERENTIAL/PLATELET
BASOS PCT: 1 %
Basophils Absolute: 0 10*3/uL (ref 0–0.1)
EOS ABS: 0.1 10*3/uL (ref 0–0.7)
Eosinophils Relative: 4 %
HCT: 36.7 % — ABNORMAL LOW (ref 40.0–52.0)
HEMOGLOBIN: 12.6 g/dL — AB (ref 13.0–18.0)
Lymphocytes Relative: 18 %
Lymphs Abs: 0.4 10*3/uL — ABNORMAL LOW (ref 1.0–3.6)
MCH: 36.6 pg — ABNORMAL HIGH (ref 26.0–34.0)
MCHC: 34.2 g/dL (ref 32.0–36.0)
MCV: 107 fL — ABNORMAL HIGH (ref 80.0–100.0)
Monocytes Absolute: 0.3 10*3/uL (ref 0.2–1.0)
Monocytes Relative: 13 %
NEUTROS PCT: 64 %
Neutro Abs: 1.6 10*3/uL (ref 1.4–6.5)
Platelets: 101 10*3/uL — ABNORMAL LOW (ref 150–440)
RBC: 3.43 MIL/uL — ABNORMAL LOW (ref 4.40–5.90)
RDW: 16 % — ABNORMAL HIGH (ref 11.5–14.5)
WBC: 2.5 10*3/uL — ABNORMAL LOW (ref 3.8–10.6)

## 2016-10-03 NOTE — Patient Instructions (Signed)
We would like for you to go to the ER at the [facility] at this time due to your Jaundice (yellow skin and eyes) and Abdominal Pain and distention.  Your labs from this morning are not reassuring and you will most likely need to be admitted to the [facility] hospital.

## 2016-10-03 NOTE — Progress Notes (Signed)
71 yo Hx of HCC initially seen by me for a left IH. Now comes w worsening abdominal pain, pain is dull, moderate and intermittent and increase in abd girth. No fevers or chills, some early satiety.U/S personally reviewed + GS and cirrhosis and ascitis CBC w neutropenia and thrombocytopenia. PL 101  ROS: Full ROS performed and is Otherwise neg   PE NAD, awake alert Eyes: sclera w some jaundice. EOMI Neck:No masses, no JVD, trach midline Resp: normal resp effort, no accessory muscle, no chest wall tenderness CV: NSR, good peripheral pulses Abd: liver now is plapble below the costal margin and is mildly tender, no peritonitis. Ascites and reducible LIH Ext: well perfused and warm  A/p HCC w deterioration of liver fx now w ascitis suggesting potential complication ( portal HTN, budd chiari). Mr Cloos is [facility] an has been treated for his [facility] at [facility]. I have no records from the [place] visits despite multiple efforts to obtain them. Encourage pt to seek immediate attention at the [place] via the Er for his worsening liver failure that will require optimization, further w/u and likely paracentesis. No need for emergent surgical intervention. Extensive counseling provided.

## 2016-10-06 NOTE — Telephone Encounter (Signed)
Patient was seen in office on Friday 10/03/16- He was sent from our office to the [facility] Emergency Room in [facility] due to Jaundice, Abdominal Pain and distention.

## 2017-11-12 ENCOUNTER — Other Ambulatory Visit: Payer: Self-pay

## 2017-11-12 ENCOUNTER — Emergency Department: Payer: Medicare Other

## 2017-11-12 ENCOUNTER — Inpatient Hospital Stay
Admission: EM | Admit: 2017-11-12 | Discharge: 2017-11-16 | DRG: 432 | Disposition: A | Discharge status: Hospice | Payer: Medicare Other | Attending: Internal Medicine | Admitting: Internal Medicine

## 2017-11-12 DIAGNOSIS — E872 Acidosis: Secondary | ICD-10-CM | POA: Diagnosis present

## 2017-11-12 DIAGNOSIS — J449 Chronic obstructive pulmonary disease, unspecified: Secondary | ICD-10-CM | POA: Diagnosis present

## 2017-11-12 DIAGNOSIS — Z681 Body mass index (BMI) 19 or less, adult: Secondary | ICD-10-CM | POA: Diagnosis not present

## 2017-11-12 DIAGNOSIS — R410 Disorientation, unspecified: Secondary | ICD-10-CM

## 2017-11-12 DIAGNOSIS — E875 Hyperkalemia: Secondary | ICD-10-CM | POA: Diagnosis present

## 2017-11-12 DIAGNOSIS — K922 Gastrointestinal hemorrhage, unspecified: Secondary | ICD-10-CM

## 2017-11-12 DIAGNOSIS — D649 Anemia, unspecified: Secondary | ICD-10-CM

## 2017-11-12 DIAGNOSIS — Z66 Do not resuscitate: Secondary | ICD-10-CM | POA: Diagnosis present

## 2017-11-12 DIAGNOSIS — K7040 Alcoholic hepatic failure without coma: Secondary | ICD-10-CM | POA: Diagnosis present

## 2017-11-12 DIAGNOSIS — K560 Paralytic ileus: Secondary | ICD-10-CM | POA: Diagnosis present

## 2017-11-12 DIAGNOSIS — E43 Unspecified severe protein-calorie malnutrition: Secondary | ICD-10-CM | POA: Diagnosis present

## 2017-11-12 DIAGNOSIS — I959 Hypotension, unspecified: Secondary | ICD-10-CM | POA: Diagnosis not present

## 2017-11-12 DIAGNOSIS — E162 Hypoglycemia, unspecified: Secondary | ICD-10-CM | POA: Diagnosis present

## 2017-11-12 DIAGNOSIS — Z515 Encounter for palliative care: Secondary | ICD-10-CM | POA: Diagnosis present

## 2017-11-12 DIAGNOSIS — N19 Unspecified kidney failure: Secondary | ICD-10-CM | POA: Diagnosis not present

## 2017-11-12 DIAGNOSIS — R109 Unspecified abdominal pain: Secondary | ICD-10-CM

## 2017-11-12 DIAGNOSIS — N179 Acute kidney failure, unspecified: Secondary | ICD-10-CM | POA: Diagnosis not present

## 2017-11-12 DIAGNOSIS — F419 Anxiety disorder, unspecified: Secondary | ICD-10-CM | POA: Diagnosis present

## 2017-11-12 DIAGNOSIS — Z8249 Family history of ischemic heart disease and other diseases of the circulatory system: Secondary | ICD-10-CM | POA: Diagnosis not present

## 2017-11-12 DIAGNOSIS — K7460 Unspecified cirrhosis of liver: Secondary | ICD-10-CM

## 2017-11-12 DIAGNOSIS — Z7189 Other specified counseling: Secondary | ICD-10-CM | POA: Diagnosis not present

## 2017-11-12 DIAGNOSIS — F17210 Nicotine dependence, cigarettes, uncomplicated: Secondary | ICD-10-CM | POA: Diagnosis present

## 2017-11-12 DIAGNOSIS — E860 Dehydration: Secondary | ICD-10-CM | POA: Diagnosis present

## 2017-11-12 DIAGNOSIS — C220 Liver cell carcinoma: Secondary | ICD-10-CM | POA: Diagnosis present

## 2017-11-12 DIAGNOSIS — K7290 Hepatic failure, unspecified without coma: Secondary | ICD-10-CM | POA: Diagnosis not present

## 2017-11-12 DIAGNOSIS — Z0189 Encounter for other specified special examinations: Secondary | ICD-10-CM

## 2017-11-12 DIAGNOSIS — E7220 Disorder of urea cycle metabolism, unspecified: Secondary | ICD-10-CM

## 2017-11-12 DIAGNOSIS — K7031 Alcoholic cirrhosis of liver with ascites: Secondary | ICD-10-CM | POA: Diagnosis present

## 2017-11-12 DIAGNOSIS — G9340 Encephalopathy, unspecified: Secondary | ICD-10-CM

## 2017-11-12 DIAGNOSIS — D6959 Other secondary thrombocytopenia: Secondary | ICD-10-CM | POA: Diagnosis present

## 2017-11-12 LAB — URINALYSIS, COMPLETE (UACMP) WITH MICROSCOPIC
Bacteria, UA: NONE SEEN
Bilirubin Urine: NEGATIVE
GLUCOSE, UA: NEGATIVE mg/dL
Hgb urine dipstick: NEGATIVE
Ketones, ur: NEGATIVE mg/dL
Leukocytes, UA: NEGATIVE
NITRITE: NEGATIVE
PH: 5 (ref 5.0–8.0)
Protein, ur: NEGATIVE mg/dL
SPECIFIC GRAVITY, URINE: 1.018 (ref 1.005–1.030)

## 2017-11-12 LAB — IRON AND TIBC
IRON: 110 ug/dL (ref 45–182)
Saturation Ratios: 73 % — ABNORMAL HIGH (ref 17.9–39.5)
TIBC: 150 ug/dL — AB (ref 250–450)
UIBC: 40 ug/dL

## 2017-11-12 LAB — URINE DRUG SCREEN, QUALITATIVE (ARMC ONLY)
AMPHETAMINES, UR SCREEN: NOT DETECTED
Barbiturates, Ur Screen: NOT DETECTED
Benzodiazepine, Ur Scrn: NOT DETECTED
COCAINE METABOLITE, UR ~~LOC~~: POSITIVE — AB
Cannabinoid 50 Ng, Ur ~~LOC~~: NOT DETECTED
MDMA (ECSTASY) UR SCREEN: NOT DETECTED
Methadone Scn, Ur: NOT DETECTED
OPIATE, UR SCREEN: NOT DETECTED
Phencyclidine (PCP) Ur S: NOT DETECTED
Tricyclic, Ur Screen: NOT DETECTED

## 2017-11-12 LAB — COMPREHENSIVE METABOLIC PANEL
ALT: 227 U/L — AB (ref 17–63)
ANION GAP: 12 (ref 5–15)
AST: 522 U/L — ABNORMAL HIGH (ref 15–41)
Albumin: 2.1 g/dL — ABNORMAL LOW (ref 3.5–5.0)
Alkaline Phosphatase: 193 U/L — ABNORMAL HIGH (ref 38–126)
BUN: 59 mg/dL — ABNORMAL HIGH (ref 6–20)
CALCIUM: 7.5 mg/dL — AB (ref 8.9–10.3)
CO2: 19 mmol/L — AB (ref 22–32)
CREATININE: 2.39 mg/dL — AB (ref 0.61–1.24)
Chloride: 102 mmol/L (ref 101–111)
GFR, EST AFRICAN AMERICAN: 30 mL/min — AB (ref >60)
GFR, EST NON AFRICAN AMERICAN: 25 mL/min — AB (ref >60)
Glucose, Bld: 77 mg/dL (ref 65–99)
Potassium: 5.7 mmol/L — ABNORMAL HIGH (ref 3.5–5.1)
SODIUM: 133 mmol/L — AB (ref 135–145)
Total Bilirubin: 6.4 mg/dL — ABNORMAL HIGH (ref 0.3–1.2)
Total Protein: 7.8 g/dL (ref 6.5–8.1)

## 2017-11-12 LAB — CBC WITH DIFFERENTIAL/PLATELET
Basophils Absolute: 0.1 10*3/uL (ref 0–0.1)
Basophils Relative: 1 %
Eosinophils Absolute: 0.1 10*3/uL (ref 0–0.7)
Eosinophils Relative: 1 %
HCT: 22.5 % — ABNORMAL LOW (ref 40.0–52.0)
Hemoglobin: 7.5 g/dL — ABNORMAL LOW (ref 13.0–18.0)
LYMPHS ABS: 0.6 10*3/uL — AB (ref 1.0–3.6)
LYMPHS PCT: 7 %
MCH: 39.9 pg — AB (ref 26.0–34.0)
MCHC: 33.1 g/dL (ref 32.0–36.0)
MCV: 120.4 fL — AB (ref 80.0–100.0)
MONOS PCT: 9 %
Monocytes Absolute: 0.9 10*3/uL (ref 0.2–1.0)
Neutro Abs: 7.6 10*3/uL — ABNORMAL HIGH (ref 1.4–6.5)
Neutrophils Relative %: 82 %
PLATELETS: 119 10*3/uL — AB (ref 150–440)
RBC: 1.87 MIL/uL — AB (ref 4.40–5.90)
RDW: 22.5 % — ABNORMAL HIGH (ref 11.5–14.5)
WBC: 9.2 10*3/uL (ref 3.8–10.6)

## 2017-11-12 LAB — ABO/RH: ABO/RH(D): O POS

## 2017-11-12 LAB — HEPATIC FUNCTION PANEL
ALT: 236 U/L — AB (ref 17–63)
AST: 536 U/L — AB (ref 15–41)
Albumin: 2.2 g/dL — ABNORMAL LOW (ref 3.5–5.0)
Alkaline Phosphatase: 196 U/L — ABNORMAL HIGH (ref 38–126)
BILIRUBIN INDIRECT: 2.6 mg/dL — AB (ref 0.3–0.9)
Bilirubin, Direct: 4.1 mg/dL — ABNORMAL HIGH (ref 0.1–0.5)
Total Bilirubin: 6.7 mg/dL — ABNORMAL HIGH (ref 0.3–1.2)
Total Protein: 8.2 g/dL — ABNORMAL HIGH (ref 6.5–8.1)

## 2017-11-12 LAB — AMMONIA: Ammonia: 133 umol/L — ABNORMAL HIGH (ref 9–35)

## 2017-11-12 LAB — FERRITIN: Ferritin: 1854 ng/mL — ABNORMAL HIGH (ref 24–336)

## 2017-11-12 LAB — PROTIME-INR
INR: 2.49
Prothrombin Time: 26.7 seconds — ABNORMAL HIGH (ref 11.4–15.2)

## 2017-11-12 LAB — PREPARE RBC (CROSSMATCH)

## 2017-11-12 LAB — VITAMIN B12: Vitamin B-12: 4743 pg/mL — ABNORMAL HIGH (ref 180–914)

## 2017-11-12 LAB — ETHANOL

## 2017-11-12 LAB — GLUCOSE, CAPILLARY: GLUCOSE-CAPILLARY: 95 mg/dL (ref 65–99)

## 2017-11-12 LAB — FOLATE: Folate: 15.3 ng/mL (ref >5.9)

## 2017-11-12 LAB — TROPONIN I: TROPONIN I: 0.06 ng/mL — AB (ref <0.03)

## 2017-11-12 MED ORDER — LACTULOSE ENEMA
300.0000 mL | Freq: Two times a day (BID) | ORAL | Status: DC
  Administered 2017-11-12 – 2017-11-13 (×3): 300 mL via RECTAL
  Filled 2017-11-12 (×4): qty 300

## 2017-11-12 MED ORDER — SODIUM CHLORIDE 0.9 % IV SOLN
INTRAVENOUS | Status: DC
  Administered 2017-11-12: 1000 mL via INTRAVENOUS
  Administered 2017-11-13: 01:00:00 via INTRAVENOUS

## 2017-11-12 MED ORDER — LORAZEPAM 2 MG/ML IJ SOLN
2.0000 mg | Freq: Once | INTRAMUSCULAR | Status: AC
  Administered 2017-11-12: 2 mg via INTRAVENOUS
  Filled 2017-11-12: qty 1

## 2017-11-12 MED ORDER — SODIUM CHLORIDE 0.9 % IV SOLN
Freq: Once | INTRAVENOUS | Status: AC
  Administered 2017-11-12: 1000 mL via INTRAVENOUS

## 2017-11-12 MED ORDER — ONDANSETRON HCL 4 MG/2ML IJ SOLN: 4.0000 mg | Freq: Four times a day (QID) | INTRAMUSCULAR | Status: DC | PRN

## 2017-11-12 MED ORDER — HEPARIN SODIUM (PORCINE) 5000 UNIT/ML IJ SOLN
5000.0000 [IU] | Freq: Three times a day (TID) | INTRAMUSCULAR | Status: DC
  Administered 2017-11-13: 5000 [IU] via SUBCUTANEOUS
  Filled 2017-11-12: qty 1

## 2017-11-12 MED ORDER — SODIUM CHLORIDE 0.9 % IV SOLN
10.0000 mL/h | Freq: Once | INTRAVENOUS | Status: AC
  Administered 2017-11-12: 10 mL/h via INTRAVENOUS

## 2017-11-12 MED ORDER — ONDANSETRON HCL 4 MG PO TABS: 4.0000 mg | ORAL_TABLET | Freq: Four times a day (QID) | ORAL | Status: DC | PRN

## 2017-11-12 MED ORDER — PANTOPRAZOLE SODIUM 40 MG IV SOLR
40.0000 mg | Freq: Two times a day (BID) | INTRAVENOUS | Status: DC
  Administered 2017-11-12 – 2017-11-13 (×3): 40 mg via INTRAVENOUS
  Filled 2017-11-12 (×3): qty 40

## 2017-11-12 NOTE — ED Notes (Signed)
Stuck X 2 by this RN for blood.

## 2017-11-12 NOTE — ED Notes (Signed)
11/12/2017 12:01 PM Troponin 0.06 verbal to Dr. Lenise Arena.

## 2017-11-12 NOTE — ED Notes (Signed)
Bladder scan 817 ml. Told Dr. Posey Pronto and Dr. Jimmye Norman in person. Orders placed.

## 2017-11-12 NOTE — ED Triage Notes (Addendum)
To ER via [facility] from home c/o altered mental status. Pt found by family, defecating on himself. CBG 67 with EMS, given half amp of D50, CBG 160's currently. Pt grimacing and holding abdomen of time of arrival.  Pt will not stay still in bed, rolling around in bed.

## 2017-11-12 NOTE — ED Notes (Signed)
Pt cleaned of stool. Pt noncooperative.

## 2017-11-12 NOTE — Consult Note (Addendum)
Richard Wood , MD [address] [place], [place], [place], [address] [address] [place], [place], [place], [address] Phone: [phone]  Fax: [phone]  Consultation  Referring Provider:   ]Dr Posey Pronto  Primary Care Physician:  System, Pcp Not In Primary Gastroenterologist: None          Reason for Consultation:     Liver disease   Date of Admission:  11/12/2017 Date of Consultation:  11/12/2017         HPI:   Richard Wood is a 72 y.o. male with a history of cirrhosis , COPD and [place]. Admitted with confusion . Last note on Epic with Dr Dahlia Byes in 09/2016 shows that the patient had ascites, [place]. He was sent to the [facility] hospital at [facility] at that time .   On admisison Hb 7.5 with MCV of 120 ,platelet count 119. Cr 2.39 , elevated AST,ALT,ALk phos. Elevated troponin. Urine positive for cocaine.    Patient unable to give history, in the room with daughter and Ex wife, he was sedated due to cmobativeness. Per family continued to drink alcohol till recently , not a candidate for liver transplant. They are unsure of the prognosis of his liver disease or HCC. Says he has been very drowsy till yesterday.   Past Medical History:  Diagnosis Date  . Asthma   . Cirrhosis ([place])   . COPD (chronic obstructive pulmonary disease) ([place])   . Liver cancer [facility]     Past Surgical History:  Procedure Laterality Date  . ANKLE SURGERY Left 1980's  . LIVER SURGERY  11/2015   [employer]    Prior to Admission medications   Medication Sig Start Date End Date Taking? Authorizing Provider  gabapentin (NEURONTIN) 300 MG capsule Take 1 capsule (300 mg total) by mouth 3 (three) times daily. 08/28/16   Pabon, [place], MD  ibuprofen (ADVIL,MOTRIN) 200 MG tablet Take 200 mg by mouth every 6 (six) hours as needed.    [provider]  Naproxen Sodium (ALEVE) 220 MG CAPS Take 2 capsules by mouth every 8 (eight) hours as needed.    [provider]    Family History  Problem Relation  Age of Onset  . Heart disease Father      Social History   Tobacco Use  . Smoking status: Current Every Day Smoker    Packs/day: 0.25    Types: Cigarettes  . Smokeless tobacco: Never Used  Substance Use Topics  . Alcohol use: No    Comment: Last Drink- 09/2015 - Prior Heavy ETOH Use  . Drug use: Yes    Types: Cocaine, Marijuana    Comment: Last Use- 07/2016 per patient    Allergies as of 11/12/2017 - Review Complete 11/12/2017  Allergen Reaction Noted  . Influenza vaccine recombinant Hives 08/04/2016  . Other Hives 11/05/2015    Review of Systems:    Patient unable to provide   Physical Exam:  Vital signs in last 24 hours: Temp:  [97.7 F (36.5 C)-98 F (36.7 C)] 98 F (36.7 C) (02/28 1345) Pulse Rate:  [92-104] 92 (02/28 1345) Resp:  [16-28] 18 (02/28 1345) BP: (107-136)/(46-77) 108/46 (02/28 1345) SpO2:  [98 %-100 %] 99 % (02/28 1345) Weight:  [103 lb 6.4 oz (46.9 kg)] 103 lb 6.4 oz (46.9 kg) (02/28 1345)   General: drowsy  Head:  Normocephalic and atraumatic. Eyes:  + icterus.   Conjunctiva pink. PERRLA. Ears: cannot assess  Neck:  Supple; no masses or  thyroidomegaly Lungs: Respirations even and unlabored. Lungs clear to auscultation bilaterally.   No wheezes, crackles, or rhonchi.  Heart:  Regular rate and rhythm;  Without murmur, clicks, rubs or gallops Abdomen:  Distended, fluid thrill ,  Normal bowel sounds. No appreciable masses or hepatomegaly.  No rebound or guarding.  Neurologic:  Alert and oriented x0 . Skin:  Intact without significant lesions or rashes. Cervical Nodes:  No significant cervical adenopathy. Psych:  Unable to assess   LAB RESULTS: Recent Labs    11/12/17 1030  WBC 9.2  HGB 7.5*  HCT 22.5*  PLT 119*   BMET Recent Labs    11/12/17 1030  NA 133*  K 5.7*  CL 102  CO2 19*  GLUCOSE 77  BUN 59*  CREATININE 2.39*  CALCIUM 7.5*   LFT Recent Labs    11/12/17 1030  PROT 8.2*  7.8  ALBUMIN 2.2*  2.1*  AST 536*  522*    ALT 236*  227*  ALKPHOS 196*  193*  BILITOT 6.7*  6.4*  BILIDIR 4.1*  IBILI 2.6*   PT/INR No results for input(s): LABPROT, INR in the last 72 hours.  STUDIES: Ct Head Wo Contrast  Result Date: 11/12/2017 CLINICAL DATA:  Altered level of consciousness, holding the abdomen EXAM: CT HEAD WITHOUT CONTRAST TECHNIQUE: Contiguous axial images were obtained from the base of the skull through the vertex without intravenous contrast. COMPARISON:  CT brain scan of 12/02/2008 FINDINGS: Brain: The ventricular system remains somewhat prominent as are the cortical sulci, indicative of diffuse atrophy. The septum is midline in position. The fourth ventricle and basilar cisterns are stable. No hemorrhage, mass lesion, or acute infarction is seen. Benign-appearing bilateral basal ganglial calcifications are noted. Vascular: No vascular abnormality is seen on this unenhanced study. Skull: On bone window images, no calvarial abnormality is seen. Sinuses/Orbits: There is only a small amount of mucosal edema and debris within the left partition of the sphenoid sinus. The remainder of the paranasal sinuses are well pneumatized. Other: None. IMPRESSION: 1. Atrophy.  No acute intracranial abnormality. 2. Mild left sphenoid sinus debris and mucosal thickening. Electronically Signed   By: Ivar Drape M.D.   On: 11/12/2017 12:05   Dg Abdomen Acute W/chest  Result Date: 11/12/2017 CLINICAL DATA:  Altered mental status. EXAM: DG ABDOMEN ACUTE W/ 1V CHEST COMPARISON:  12/19/2013. FINDINGS: Mediastinum and hilar structures are normal. Lungs are clear. No pleural effusion or pneumothorax. Solitary symmetric nodular opacities noted over both lung bases consistent with nipple shadows. EKG leads noted over the chest. Heart size normal. No acute bony abnormality. Air-filled loops of small and large bowel are noted suggesting adynamic ileus. Follow-up exam to demonstrate clearing and to exclude bowel obstruction suggested. No free  air. Thoracolumbar spine scoliosis. IMPRESSION: 1.  No acute cardiopulmonary disease. 2. Distended loops of small and large bowel suggesting adynamic ileus. Follow-up exam suggested in order to demonstrate resolution and to exclude bowel obstruction. Electronically Signed   By: Marcello Moores  Register   On: 11/12/2017 11:44      Impression / Plan:   Richard Wood is a 72 y.o. y/o male with alcoholic cirrhosis of the liver with [place], unclear stage of [place] as his care is at the [place], admitted with confusion , likely hepatic encephelopathy, AKI. Presently received sedation and is very drowsy    Plan  1, Check INR which is an indicator of liver failure. , if over 1.5 then please administer vitamin K S.C 10 mg once  a day for 3 days.  2. IF mental status does not improve consider CT scan of the head 3. Will need records from [facility] to determine where his treatment and prognosis stands with respect to the [facility] and based on that goals of care may need to be discussed 4. In the meanwhile for hepatic encephalopathy - correct AKI, avoid dehydration by administrating excess lactulose. Suggest NG tube with lactulose titrated to two soft bowel movements, correct electrolytes and avoid narcotics or benzodiazepines. Screen for infection and treat .  5. Diagnostic paracentesis to r/o SBP which can be the cause of his confusion and hepatic encephalopathy. USG abdomen .Doppler liver to r/o portal vein thrmbosis . 6.check color of the stool - do not do stool occult tests , a negative or a positive test does not indicate an active bleed or a prior bleed and does not change management .  7. Check iron studies , b12,folate.  8 . Palliative care consult for goals of care  9. May need input from oncology in terms of his [facility] when records available.     Thank you for involving me in the care of this patient.      LOS: 0 days   Richard Bellows, MD  11/12/2017, 2:12 PM

## 2017-11-12 NOTE — Progress Notes (Signed)
Pt unresponsive to voice, touch, or painful stimuli. I am unable to get a history or consent for blood transfusion from pt. Called emergency contact Jalene Lacko (daughter) and left a message. Waiting for a return call.

## 2017-11-12 NOTE — ED Notes (Signed)
Pt has been stuck multiple times without success in blood, charge RN notified, lab called for assistance.

## 2017-11-12 NOTE — ED Provider Notes (Addendum)
[facility] Emergency Department Provider Note       Time seen: ----------------------------------------- 10:22 AM on 11/12/2017 ----------------------------------------- Level V caveat: History/ROS limited by altered mental status  I have reviewed the triage vital signs and the nursing notes.  HISTORY   Chief Complaint Altered Mental Status    HPI Richard Wood is a 72 y.o. male with a history of asthma, cirrhosis, COPD and liver cancer who presents to the ED for altered mental status.  Patient lives at home and reportedly family arrived to find him confused and somewhat combative.  No further information is unknown.  It is not known how long this has been taking place.  Past Medical History:  Diagnosis Date  . Asthma   . Cirrhosis ([place])   . COPD (chronic obstructive pulmonary disease) ([place])   . Liver cancer [facility])     Patient Active Problem List   Diagnosis Date Noted  . Richard Wood (hepatocellular carcinoma) ([place]) 11/06/2015    Past Surgical History:  Procedure Laterality Date  . ANKLE SURGERY Left 1980's  . LIVER SURGERY  11/2015   [employer]    Allergies Influenza vaccine recombinant and Other  Social History Social History   Tobacco Use  . Smoking status: Current Every Day Smoker    Packs/day: 0.25    Types: Cigarettes  . Smokeless tobacco: Never Used  Substance Use Topics  . Alcohol use: No    Comment: Last Drink- 09/2015 - Prior Heavy ETOH Use  . Drug use: Yes    Types: Cocaine, Marijuana    Comment: Last Use- 07/2016 per patient    Review of Systems Positive for altered mental status  All systems are unknown at this time  ____________________________________________   PHYSICAL EXAM:  VITAL SIGNS: ED Triage Vitals  Enc Vitals Group     BP      Pulse      Resp      Temp      Temp src      SpO2      Weight      Height      Head Circumference      Peak Flow      Pain Score      Pain Loc      Pain Edu?       Excl. in [place]?     Constitutional: Disoriented and agitated, mild to moderate distress  ENT   Head: Normocephalic and atraumatic.   Nose: No congestion/rhinnorhea.   Mouth/Throat: Mucous membranes are moist.   Neck: No stridor. Cardiovascular: Normal rate, regular rhythm. No murmurs, rubs, or gallops. Respiratory: Normal respiratory effort without tachypnea nor retractions. Breath sounds are clear and equal bilaterally. No wheezes/rales/rhonchi. Gastrointestinal: Distended, rigid abdomen.  Hypoactive bowel sounds. Rectal: No gross blood, heme positive stool Musculoskeletal: No lower extremity tenderness nor edema. Neurologic: Patient is moaning, seems to be moving all of his extremities Skin:  Skin is warm, dry and intact. No rash noted. Psychiatric: Patient is agitated and will not cooperate with examination ____________________________________________  EKG: Interpreted by me.  Sinus tachycardia with a rate of 101 bpm, borderline right axis deviation, nonspecific T wave abnormalities, normal QT.    ____________________________________________  ED COURSE:  As part of my medical decision making, I reviewed the following data within the [place] History obtained from family if available, nursing notes, old chart and ekg, as well as notes from prior ED visits. Patient presented for altered mental status, we  will assess with labs and imaging as indicated at this time. Clinical Course as of Nov 12 1201  Thu Nov 12, 2017  1114 Patient had to be given Ativan 2 mg to place IV access  [JW]  1157 Ammonia: (!) 133 [JW]  1157 Hemoglobin: (!) 7.5 [JW]    Clinical Course User Index [JW] Earleen Newport, MD   Procedures ____________________________________________   LABS (pertinent positives/negatives)  Labs Reviewed  CBC WITH DIFFERENTIAL/PLATELET - Abnormal; Notable for the following components:      Result Value   RBC 1.87 (*)    Hemoglobin 7.5 (*)     HCT 22.5 (*)    MCV 120.4 (*)    MCH 39.9 (*)    RDW 22.5 (*)    Neutro Abs 7.6 (*)    Lymphs Abs 0.6 (*)    All other components within normal limits  COMPREHENSIVE METABOLIC PANEL - Abnormal; Notable for the following components:   Sodium 133 (*)    Potassium 5.7 (*)    CO2 19 (*)    BUN 59 (*)    Creatinine, Ser 2.39 (*)    Calcium 7.5 (*)    Albumin 2.1 (*)    AST 522 (*)    ALT 227 (*)    Alkaline Phosphatase 193 (*)    Total Bilirubin 6.4 (*)    GFR calc non Af Amer 25 (*)    GFR calc Af Amer 30 (*)    All other components within normal limits  TROPONIN I - Abnormal; Notable for the following components:   Troponin I 0.06 (*)    All other components within normal limits  URINALYSIS, COMPLETE (UACMP) WITH MICROSCOPIC - Abnormal; Notable for the following components:   Color, Urine AMBER (*)    APPearance HAZY (*)    Squamous Epithelial / LPF 0-5 (*)    All other components within normal limits  AMMONIA - Abnormal; Notable for the following components:   Ammonia 133 (*)    All other components within normal limits  BLOOD GAS, VENOUS - Abnormal; Notable for the following components:   pCO2, Ven 36 (*)    Acid-base deficit 3.5 (*)    All other components within normal limits  ETHANOL  GLUCOSE, CAPILLARY  URINE DRUG SCREEN, QUALITATIVE ([facility] ONLY)  CBG MONITORING, ED  TYPE AND SCREEN  PREPARE RBC (CROSSMATCH)   CRITICAL CARE Performed by: Laurence Aly   Total critical care time: 30 minutes  Critical care time was exclusive of separately billable procedures and treating other patients.  Critical care was necessary to treat or prevent imminent or life-threatening deterioration.  Critical care was time spent personally by me on the following activities: development of treatment plan with patient and/or surrogate as well as nursing, discussions with consultants, evaluation of patient's response to treatment, examination of patient, obtaining history from  patient or surrogate, ordering and performing treatments and interventions, ordering and review of laboratory studies, ordering and review of radiographic studies, pulse oximetry and re-evaluation of patient's condition.  RADIOLOGY Images were viewed by me  CT head, acute abdominal series IMPRESSION: 1. No acute cardiopulmonary disease.  2. Distended loops of small and large bowel suggesting adynamic ileus. Follow-up exam suggested in order to demonstrate resolution and to exclude bowel obstruction. ____________________________________________  DIFFERENTIAL DIAGNOSIS   Dehydration, electrolyte abnormality, hyperammonemia, sepsis, CVA, MI, bowel obstruction, perforation  FINAL ASSESSMENT AND PLAN  Altered mental status, hyperammonemia, renal failure, anemia, GI bleeding, elevated troponin   Plan:  Patient had presented for altered mental status. Patient's labs revealed numerous abnormalities likely secondary to chronic liver disease and possibly liver cancer. Patient's imaging revealed likely ileus.  We will begin treatment with rectal administration of lactulose.  I have also ordered for a blood transfusion for him given his elevated troponin and acute blood loss compared to prior.  He is receiving a liter of normal saline as well.  He remains in critical condition.  Family states explicitly that he is DNR/DNI.   Laurence Aly, MD   Note: This note was generated in part or whole with voice recognition software. Voice recognition is usually quite accurate but there are transcription errors that can and very often do occur. I apologize for any typographical errors that were not detected and corrected.     Earleen Newport, MD 11/12/17 1206    Earleen Newport, MD 11/12/17 [phone]

## 2017-11-12 NOTE — ED Notes (Signed)
Attempt for IV X 2 by this RN unsuccessful.

## 2017-11-12 NOTE — ED Notes (Addendum)
Attempt to call daughter, unsuccessful-left message to return call. Discussed with EDP in regards to consent for blood. Dr. Lenise Arena states that we may give the blood under emergency consent.

## 2017-11-12 NOTE — H&P (Addendum)
[facility] NAME: Richard Wood    MR#:  [id number]  DATE OF BIRTH:  1946/04/25  DATE OF ADMISSION:  11/12/2017  PRIMARY CARE PHYSICIAN: System, Pcp Not In   REQUESTING/REFERRING PHYSICIAN:   CHIEF COMPLAINT:   Chief Complaint  Patient presents with  . Altered Mental Status    HISTORY OF PRESENT ILLNESS: Richard Wood  is a 72 y.o. male with a known history of asthma, liver cirrhosis, COPD and liver cirrhosis who was brought to the from the house due to altered mental status.  Patient is noted to have acute renal failure anemia and ammonia this very high.  He had to receive Ativan in the emergency room.  Family member was here earlier who stated he was DNR to the ER physician that has been documented.  Currently he is unable to provide any review of systems I try to call his daughter but she is not available.  PAST MEDICAL HISTORY:   Past Medical History:  Diagnosis Date  . Asthma   . Cirrhosis ([place])   . COPD (chronic obstructive pulmonary disease) ([place])   . Liver cancer ([place])     PAST SURGICAL HISTORY:  Past Surgical History:  Procedure Laterality Date  . ANKLE SURGERY Left 1980's  . LIVER SURGERY  11/2015   [employer] [employer]    SOCIAL HISTORY:  Social History   Tobacco Use  . Smoking status: Current Every Day Smoker    Packs/day: 0.25    Types: Cigarettes  . Smokeless tobacco: Never Used  Substance Use Topics  . Alcohol use: No    Comment: Last Drink- 09/2015 - Prior Heavy ETOH Use    FAMILY HISTORY:  Family History  Problem Relation Age of Onset  . Heart disease Father     DRUG ALLERGIES:  Allergies  Allergen Reactions  . Influenza Vaccine Recombinant Hives  . Other Hives    REVIEW OF SYSTEMS:   CONSTITUTIONAL: Unable to provide   MEDICATIONS AT HOME:  Prior to Admission medications   Medication Sig Start Date End Date Taking? Authorizing Provider  gabapentin (NEURONTIN) 300 MG capsule Take 1 capsule  (300 mg total) by mouth 3 (three) times daily. 08/28/16   [place], [place], MD  ibuprofen (ADVIL,MOTRIN) 200 MG tablet Take 200 mg by mouth every 6 (six) hours as needed.    [provider]  Naproxen Sodium (ALEVE) 220 MG CAPS Take 2 capsules by mouth every 8 (eight) hours as needed.    [provider]      PHYSICAL EXAMINATION:   VITAL SIGNS: Blood pressure 107/77, pulse 96, temperature 97.7 F (36.5 C), temperature source Axillary, resp. rate 16, SpO2 100 %.  GENERAL:  72 y.o.-year-old patient lying in the bed critically ill EYES: Pupils equal, round, reactive to light and accommodation. No scleral icterus.  HEENT: Head atraumatic, normocephalic. Oropharynx and nasopharynx clear.  NECK:  Supple, no jugular venous distention. No thyroid enlargement, no tenderness.  LUNGS: Normal breath sounds bilaterally, no wheezing, rales,rhonchi or crepitation. No use of accessory muscles of respiration.  CARDIOVASCULAR: S1, S2 normal. No murmurs, rubs, or gallops.  ABDOMEN: Soft, nontender, nondistended. Bowel sounds present. No organomegaly or mass.  EXTREMITIES: No pedal edema, cyanosis, or clubbing.  NEUROLOGIC: Moaning not able to follow commands PSYCHIATRIC: Moaning SKIN: No obvious rash, lesion, or ulcer.   LABORATORY PANEL:   CBC Recent Labs  Lab 11/12/17 1030  WBC 9.2  HGB 7.5*  HCT 22.5*  PLT 119*  MCV 120.4*  MCH 39.9*  MCHC 33.1  RDW 22.5*  LYMPHSABS 0.6*  MONOABS 0.9  EOSABS 0.1  BASOSABS 0.1   ------------------------------------------------------------------------------------------------------------------  Chemistries  Recent Labs  Lab 11/12/17 1030  NA 133*  K 5.7*  CL 102  CO2 19*  GLUCOSE 77  BUN 59*  CREATININE 2.39*  CALCIUM 7.5*  AST 522*  ALT 227*  ALKPHOS 193*  BILITOT 6.4*   ------------------------------------------------------------------------------------------------------------------ CrCl cannot be calculated (Unknown ideal  weight.). ------------------------------------------------------------------------------------------------------------------ No results for input(s): TSH, T4TOTAL, T3FREE, THYROIDAB in the last 72 hours.  Invalid input(s): FREET3   Coagulation profile No results for input(s): INR, PROTIME in the last 168 hours. ------------------------------------------------------------------------------------------------------------------- No results for input(s): DDIMER in the last 72 hours. -------------------------------------------------------------------------------------------------------------------  Cardiac Enzymes Recent Labs  Lab 11/12/17 1030  TROPONINI 0.06*   ------------------------------------------------------------------------------------------------------------------ Invalid input(s): POCBNP  ---------------------------------------------------------------------------------------------------------------  Urinalysis    Component Value Date/Time   COLORURINE AMBER (A) 11/12/2017 1030   APPEARANCEUR HAZY (A) 11/12/2017 1030   LABSPEC 1.018 11/12/2017 1030   PHURINE 5.0 11/12/2017 1030   GLUCOSEU NEGATIVE 11/12/2017 1030   HGBUR NEGATIVE 11/12/2017 1030   BILIRUBINUR NEGATIVE 11/12/2017 1030   KETONESUR NEGATIVE 11/12/2017 1030   PROTEINUR NEGATIVE 11/12/2017 1030   NITRITE NEGATIVE 11/12/2017 1030   LEUKOCYTESUR NEGATIVE 11/12/2017 1030     RADIOLOGY: Ct Head Wo Contrast  Result Date: 11/12/2017 CLINICAL DATA:  Altered level of consciousness, holding the abdomen EXAM: CT HEAD WITHOUT CONTRAST TECHNIQUE: Contiguous axial images were obtained from the base of the skull through the vertex without intravenous contrast. COMPARISON:  CT brain scan of 12/02/2008 FINDINGS: Brain: The ventricular system remains somewhat prominent as are the cortical sulci, indicative of diffuse atrophy. The septum is midline in position. The fourth ventricle and basilar cisterns are stable. No  hemorrhage, mass lesion, or acute infarction is seen. Benign-appearing bilateral basal ganglial calcifications are noted. Vascular: No vascular abnormality is seen on this unenhanced study. Skull: On bone window images, no calvarial abnormality is seen. Sinuses/Orbits: There is only a small amount of mucosal edema and debris within the left partition of the sphenoid sinus. The remainder of the paranasal sinuses are well pneumatized. Other: None. IMPRESSION: 1. Atrophy.  No acute intracranial abnormality. 2. Mild left sphenoid sinus debris and mucosal thickening. Electronically Signed   By: Ivar Drape M.D.   On: 11/12/2017 12:05   Dg Abdomen Acute W/chest  Result Date: 11/12/2017 CLINICAL DATA:  Altered mental status. EXAM: DG ABDOMEN ACUTE W/ 1V CHEST COMPARISON:  12/19/2013. FINDINGS: Mediastinum and hilar structures are normal. Lungs are clear. No pleural effusion or pneumothorax. Solitary symmetric nodular opacities noted over both lung bases consistent with nipple shadows. EKG leads noted over the chest. Heart size normal. No acute bony abnormality. Air-filled loops of small and large bowel are noted suggesting adynamic ileus. Follow-up exam to demonstrate clearing and to exclude bowel obstruction suggested. No free air. Thoracolumbar spine scoliosis. IMPRESSION: 1.  No acute cardiopulmonary disease. 2. Distended loops of small and large bowel suggesting adynamic ileus. Follow-up exam suggested in order to demonstrate resolution and to exclude bowel obstruction. Electronically Signed   By: Marcello Moores  Register   On: 11/12/2017 11:44    EKG: Orders placed or performed during the hospital encounter of 11/12/17  . EKG 12-Lead  . EKG 12-Lead    IMPRESSION AND PLAN: Patient 72 year old white male with history of liver cirrhosis and hepatocellular carcinoma presenting with acute encephalopathy  1.  Acute encephalopathy Patient is  started on rectal lactulose enema which we will continue   2. Acute  renal failure:  LIkely related to dehydration possible hepatorenal syndrome We will give IV fluids if no improvement nephrology consult Bladder ultrasound  3.  Anemia patient is receiving 1 unit of packed RBCs in the ER Placed on PPIs, GI consult for further recommendation  4.  Hepatocellular cancer prognosis poor  5.  CODE STATUS DNR per daughter who was here earlier I will asked palliative care team to see if no improvement patient should be made comfort care   All the records are reviewed and case discussed with ED provider. Management plans discussed with the patient, family and they are in agreement.  CODE STATUS:    Code Status Orders  (From admission, onward)        Start     Ordered   11/12/17 1110  Limited resuscitation (code)  Continuous    Question Answer Comment  In the event of cardiac or respiratory ARREST: Initiate Code Blue, Call Rapid Response No   In the event of cardiac or respiratory ARREST: Perform CPR No   In the event of cardiac or respiratory ARREST: Perform Intubation/Mechanical Ventilation No   In the event of cardiac or respiratory ARREST: Use NIPPV/BiPAp only if indicated Yes   In the event of cardiac or respiratory ARREST: Administer ACLS medications if indicated Yes   In the event of cardiac or respiratory ARREST: Perform Defibrillation or Cardioversion if indicated Yes      11/12/17 1110    Code Status History    Date Active Date Inactive Code Status Order ID Comments User Context   11/12/2017 11:10 11/12/2017 11:10 DNR [id number]  Earleen Newport, MD ED       TOTAL TIME TAKING CARE OF THIS PATIENT: Patient critically  ill high risk of cardiopulmonary arrest 55 minutes of critical care time spent   Dustin Flock M.D on 11/12/2017 at 12:44 PM  Between 7am to 6pm - Pager - [phone]  After 6pm go to [email or web address] - password [email or web address] [facility]  Office  [phone]  CC: Primary care physician; System, Pcp  Not In

## 2017-11-12 NOTE — ED Notes (Signed)
Lab at bedside

## 2017-11-12 NOTE — ED Notes (Addendum)
EDP at bedside witness EDP perform rectal exam.

## 2017-11-12 NOTE — ED Notes (Signed)
Rectal tube inserted by this RN. Rush Landmark, RN assisted. Tolerated well.

## 2017-11-12 NOTE — ED Notes (Signed)
Report to Ashley, RN

## 2017-11-12 NOTE — ED Notes (Signed)
Pt back from CT

## 2017-11-12 NOTE — ED Notes (Addendum)
Pt taken to floor by Dorian, ED Tech with shirt, and soiled briefs. Foley emptied prior to transport. Pt at baseline since being in ER. Brief clean.   Lactulose not down from pharmacy at this time.

## 2017-11-13 ENCOUNTER — Inpatient Hospital Stay: Payer: Medicare Other

## 2017-11-13 DIAGNOSIS — N179 Acute kidney failure, unspecified: Secondary | ICD-10-CM

## 2017-11-13 DIAGNOSIS — E43 Unspecified severe protein-calorie malnutrition: Secondary | ICD-10-CM

## 2017-11-13 DIAGNOSIS — I959 Hypotension, unspecified: Secondary | ICD-10-CM

## 2017-11-13 DIAGNOSIS — K7290 Hepatic failure, unspecified without coma: Secondary | ICD-10-CM

## 2017-11-13 LAB — GLUCOSE, CAPILLARY
GLUCOSE-CAPILLARY: 96 mg/dL (ref 65–99)
Glucose-Capillary: 38 mg/dL — CL (ref 65–99)
Glucose-Capillary: 247 mg/dL — ABNORMAL HIGH (ref 65–99)
Glucose-Capillary: 97 mg/dL (ref 65–99)

## 2017-11-13 LAB — BASIC METABOLIC PANEL
Anion gap: 10 (ref 5–15)
Anion gap: 9 (ref 5–15)
BUN: 67 mg/dL — AB (ref 6–20)
BUN: 66 mg/dL — ABNORMAL HIGH (ref 6–20)
CALCIUM: 7.2 mg/dL — AB (ref 8.9–10.3)
CALCIUM: 7.2 mg/dL — AB (ref 8.9–10.3)
CO2: 18 mmol/L — AB (ref 22–32)
CO2: 20 mmol/L — AB (ref 22–32)
CREATININE: 2.04 mg/dL — AB (ref 0.61–1.24)
Chloride: 108 mmol/L (ref 101–111)
Chloride: 110 mmol/L (ref 101–111)
Creatinine, Ser: 2.13 mg/dL — ABNORMAL HIGH (ref 0.61–1.24)
GFR calc Af Amer: 34 mL/min — ABNORMAL LOW (ref >60)
GFR, EST AFRICAN AMERICAN: 36 mL/min — AB (ref >60)
GFR, EST NON AFRICAN AMERICAN: 29 mL/min — AB (ref >60)
GFR, EST NON AFRICAN AMERICAN: 31 mL/min — AB (ref >60)
Glucose, Bld: 67 mg/dL (ref 65–99)
Glucose, Bld: 126 mg/dL — ABNORMAL HIGH (ref 65–99)
Potassium: 6 mmol/L — ABNORMAL HIGH (ref 3.5–5.1)
Potassium: 4.9 mmol/L (ref 3.5–5.1)
SODIUM: 139 mmol/L (ref 135–145)
Sodium: 136 mmol/L (ref 135–145)

## 2017-11-13 LAB — CBC
HEMATOCRIT: 26 % — AB (ref 40.0–52.0)
Hemoglobin: 9.1 g/dL — ABNORMAL LOW (ref 13.0–18.0)
MCH: 38.5 pg — AB (ref 26.0–34.0)
MCHC: 34.9 g/dL (ref 32.0–36.0)
MCV: 110.4 fL — AB (ref 80.0–100.0)
PLATELETS: 115 10*3/uL — AB (ref 150–440)
RBC: 2.35 MIL/uL — ABNORMAL LOW (ref 4.40–5.90)
RDW: 22.9 % — AB (ref 11.5–14.5)
WBC: 8.1 10*3/uL (ref 3.8–10.6)

## 2017-11-13 LAB — TYPE AND SCREEN
ABO/RH(D): O POS
Antibody Screen: NEGATIVE
Unit division: 0

## 2017-11-13 LAB — BPAM RBC
Blood Product Expiration Date: 201903062359
ISSUE DATE / TIME: 201902281724
Unit Type and Rh: 5100

## 2017-11-13 LAB — AMMONIA: Ammonia: 85 umol/L — ABNORMAL HIGH (ref 9–35)

## 2017-11-13 LAB — HEPATITIS PANEL, ACUTE
Hep A IgM: NEGATIVE
Hep B C IgM: NEGATIVE
Hepatitis B Surface Ag: NEGATIVE

## 2017-11-13 LAB — NA AND K (SODIUM & POTASSIUM), RAND UR: POTASSIUM UR: 61 mmol/L

## 2017-11-13 LAB — POTASSIUM
Potassium: 6.2 mmol/L — ABNORMAL HIGH (ref 3.5–5.1)
Potassium: 5.3 mmol/L — ABNORMAL HIGH (ref 3.5–5.1)

## 2017-11-13 LAB — MRSA PCR SCREENING: MRSA by PCR: NEGATIVE

## 2017-11-13 MED ORDER — CEFTRIAXONE SODIUM 2 G IJ SOLR
2.0000 g | INTRAMUSCULAR | Status: DC
  Administered 2017-11-13: 2 g via INTRAVENOUS
  Filled 2017-11-13: qty 20

## 2017-11-13 MED ORDER — SODIUM BICARBONATE 8.4 % IV SOLN
INTRAVENOUS | Status: DC
  Administered 2017-11-13 – 2017-11-14 (×2): via INTRAVENOUS
  Filled 2017-11-13 (×3): qty 100

## 2017-11-13 MED ORDER — PANTOPRAZOLE SODIUM 40 MG IV SOLR: 40.0000 mg | Freq: Two times a day (BID) | INTRAVENOUS | Status: DC

## 2017-11-13 MED ORDER — NOREPINEPHRINE BITARTRATE 1 MG/ML IV SOLN
0.0000 ug/min | INTRAVENOUS | Status: DC
  Filled 2017-11-13 (×2): qty 4

## 2017-11-13 MED ORDER — DEXTROSE 50 % IV SOLN
1.0000 | Freq: Once | INTRAVENOUS | Status: AC
  Administered 2017-11-13: 50 mL via INTRAVENOUS

## 2017-11-13 MED ORDER — MORPHINE SULFATE (PF) 2 MG/ML IV SOLN
1.0000 mg | INTRAVENOUS | Status: DC | PRN
  Administered 2017-11-14 – 2017-11-15 (×4): 2 mg via INTRAVENOUS
  Filled 2017-11-13 (×4): qty 1

## 2017-11-13 MED ORDER — VITAMIN K1 10 MG/ML IJ SOLN
10.0000 mg | Freq: Every day | INTRAMUSCULAR | Status: AC
  Administered 2017-11-13 – 2017-11-15 (×3): 10 mg via SUBCUTANEOUS
  Filled 2017-11-13 (×3): qty 1

## 2017-11-13 MED ORDER — DEXTROSE 50 % IV SOLN
INTRAVENOUS | Status: AC
  Filled 2017-11-13: qty 50

## 2017-11-13 MED ORDER — SODIUM POLYSTYRENE SULFONATE PO POWD
45.0000 g | Freq: Once | ORAL | Status: DC
  Administered 2017-11-13: 45 g via RECTAL
  Filled 2017-11-13 (×2): qty 45

## 2017-11-13 MED ORDER — SODIUM BICARBONATE 8.4 % IV SOLN
50.0000 meq | Freq: Once | INTRAVENOUS | Status: AC
  Administered 2017-11-13: 12:00:00 50 meq via INTRAVENOUS
  Filled 2017-11-13: qty 50

## 2017-11-13 MED ORDER — LACTULOSE ENEMA
300.0000 mL | Freq: Two times a day (BID) | ORAL | Status: DC
  Administered 2017-11-13: 300 mL via RECTAL
  Filled 2017-11-13 (×3): qty 300

## 2017-11-13 MED ORDER — INSULIN ASPART 100 UNIT/ML IV SOLN
10.0000 [IU] | Freq: Once | INTRAVENOUS | Status: AC
  Administered 2017-11-13: 10:00:00 10 [IU] via INTRAVENOUS
  Filled 2017-11-13: qty 0.1

## 2017-11-13 MED ORDER — CEFTRIAXONE SODIUM 1 G IJ SOLR: 2.0000 g | INTRAMUSCULAR | Status: DC

## 2017-11-13 MED ORDER — DEXTROSE 50 % IV SOLN
1.0000 | Freq: Once | INTRAVENOUS | Status: AC
  Administered 2017-11-13: 09:00:00 50 mL via INTRAVENOUS
  Filled 2017-11-13: qty 50

## 2017-11-13 MED ORDER — NOREPINEPHRINE BITARTRATE 1 MG/ML IV SOLN
0.0000 ug/min | INTRAVENOUS | Status: DC
  Filled 2017-11-13: qty 4

## 2017-11-13 MED ORDER — ORAL CARE MOUTH RINSE
15.0000 mL | Freq: Two times a day (BID) | OROMUCOSAL | Status: DC
  Administered 2017-11-13 (×2): 15 mL via OROMUCOSAL

## 2017-11-13 MED ORDER — DEXTROSE-NACL 5-0.45 % IV SOLN
INTRAVENOUS | Status: DC
  Administered 2017-11-13: 09:00:00 via INTRAVENOUS

## 2017-11-13 NOTE — Progress Notes (Signed)
U/S called me to return pt back to room due to VS unstable. AC and myself brought pt back to room. BP 86/41, HR 119, O2 in the high 90's. Notified Dr Vianne Bulls of the above. MD to place orders to transfer pt to ICU.

## 2017-11-13 NOTE — Progress Notes (Signed)
Family Meeting Note  Advance Directive;YEs  Today a meeting took place with ; patient's daughter.  Patient is unable to participate due to hepatic encephalopathy  The following clinical team members were present during this meeting: MD, RN  The following were discussed:Patient's diagnosis:prognosis poor due to multiorgan failure  Additional follow-up to be provided: will see how does in next 24 hrs  Time spent during discussion:20 minutes  Epifanio Lesches, MD

## 2017-11-13 NOTE — Progress Notes (Signed)
In am noted an order for 10units insulin IV.  Requested tech to check CBG. CBG 43, rechecked 38. D50 amp given. VSS. Pt lethargic, minimal responsiveness to pain. Dr Vianne Bulls notified. MD present in pt's room. Orders received.

## 2017-11-13 NOTE — Progress Notes (Signed)
Patient presented to ultrasound today for diagnostic/ therapeutic paracentesis.  On limited ultrasound of abdomen in all 4 quadrants there is only a minimal amount of ascites noted in the right upper quadrant/perihepatic region.  The fluid is not safely accessible at this time.  Images were reviewed by Dr. Anselm Pancoast.  Procedure canceled.  Family notified.

## 2017-11-13 NOTE — Plan of Care (Signed)
Per Dr. Alva Garnet, no need to see patient. May cancel consult.

## 2017-11-13 NOTE — Progress Notes (Signed)
Notified Dr Vianne Bulls of CBG 247 post 2xamps of D50. Pt is lethargic, mumbles. Per MD ok to give 10 units novolog IV, and OK to send pt to U/S,paracenthesis.

## 2017-11-13 NOTE — Progress Notes (Signed)
Initial Nutrition Assessment  DOCUMENTATION CODES:   Severe malnutrition in context of chronic illness  INTERVENTION:  Monitor for needs: GOC vs NS  NUTRITION DIAGNOSIS:   Severe Malnutrition related to chronic illness as evidenced by severe fat depletion, severe muscle depletion.  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 Patient with encephalopathy, unable to provide history. Daughter unable to provide hx either. Does state patient has lost about 50 pounds in the last year. Unable to corroborate per chart. Severely malnourished upon physical exam. Poor prognosis per CCM.   Labs reviewed:  K+ 5.3, BUN/Creatinine 67/2.13; LFTs elevated; TBili 6.4  Medications reviewed and include:  Lactulose NaHCO3 D5 at 57mL/hr --> 306 calories   Intake/Output Summary (Last 24 hours) at 11/13/2017 1639 Last data filed at 11/13/2017 3220 Gross per 24 hour  Intake 2159.17 ml  Output 1000 ml  Net 1159.17 ml      NUTRITION - FOCUSED PHYSICAL EXAM:    Most Recent Value  Orbital Region  Severe depletion  Upper Arm Region  Severe depletion  Thoracic and Lumbar Region  Severe depletion  Buccal Region  Severe depletion  Temple Region  Severe depletion  Clavicle Bone Region  Severe depletion  Clavicle and Acromion Bone Region  Severe depletion  Scapular Bone Region  Severe depletion  Dorsal Hand  Severe depletion  Patellar Region  Severe depletion  Anterior Thigh Region  Severe depletion  Posterior Calf Region  Severe depletion  Edema (RD Assessment)  None  Hair  Reviewed  Eyes  Reviewed  Mouth   Reviewed  Skin  Reviewed  Nails  Reviewed       Diet Order:  No diet orders on file  EDUCATION NEEDS:   Not appropriate for education at this time  Skin:  Skin Assessment: Reviewed RN Assessment  Last BM:  PTA  Height:   Ht Readings from Last 1 Encounters:  11/12/17 5\' 11"  (1.803 m)    Weight:   Wt Readings from Last 1 Encounters:  11/12/17 103 lb 6.4 oz (46.9 kg)    Ideal Body Weight:  78.18 kg  BMI:  Body mass index is 14.42 kg/m.  Estimated Nutritional Needs:   Kcal:  1500-1750 calories (MSJ x1.2-1.4)  Protein:  70-80 grams (1.5-1.7g/kg)  Fluid:  1.5-1.75L  Richard Anis. Chenel Wernli, MS, RD LDN Inpatient Clinical Dietitian Pager [phone]

## 2017-11-13 NOTE — Consult Note (Signed)
PULMONARY / CRITICAL CARE MEDICINE   Name: Richard Wood MRN: [id number] DOB: December 01, 1945    ADMISSION DATE:  11/12/2017 CONSULTATION DATE:  11/13/17  REFERRING MD: Vianne Bulls  PT PROFILE: 72 y.o. 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 Level V caveat. Pt's daughter does not live with him and is unable to provide further HPI. She does indicate that he has previously (and repeatedly) indicated that he does not wish to undergo ACLS or intubation/mechanical ventilation  PAST MEDICAL HISTORY :  He  has a past medical history of Asthma, Cirrhosis ([place]), COPD (chronic obstructive pulmonary disease) ([place]), and Liver cancer ([place]).  PAST SURGICAL HISTORY: He  has a past surgical history that includes Ankle surgery (Left, 1980's) and Liver surgery (11/2015).  Allergies  Allergen Reactions  . Influenza Vaccine Recombinant Hives  . Other Hives    No current facility-administered medications on file prior to encounter.    Current Outpatient Medications on File Prior to Encounter  Medication Sig  . gabapentin (NEURONTIN) 300 MG capsule Take 1 capsule (300 mg total) by mouth 3 (three) times daily.  Marland Kitchen ibuprofen (ADVIL,MOTRIN) 200 MG tablet Take 200 mg by mouth every 6 (six) hours as needed.  . Naproxen Sodium (ALEVE) 220 MG CAPS Take 2 capsules by mouth every 8 (eight) hours as needed.    FAMILY HISTORY:  His indicated that the status of his father is unknown.   SOCIAL HISTORY: He  reports that he has been smoking cigarettes.  He has been smoking about 0.25 packs per day. he has never used smokeless tobacco. He reports that he uses drugs. Drugs: Cocaine and Marijuana. He reports that he does not drink alcohol.  REVIEW OF SYSTEMS:   Level V caveat  SUBJECTIVE:    VITAL SIGNS: BP (!) 86/41 (BP Location: Left Arm)    Pulse (!) 120   Temp 98.1 F (36.7 C) (Oral)   Resp 18   Ht 5\' 11"  (1.803 m)   Wt 46.9 kg (103 lb 6.4 oz)   SpO2 97%   BMI 14.42 kg/m   HEMODYNAMICS:    VENTILATOR SETTINGS:    INTAKE / OUTPUT: I/O last 3 completed shifts: In: 3159.2 [I.V.:2789.2; Blood:370] Out: 1500 [Urine:1500]  PHYSICAL EXAMINATION: General: Somnolent, tachypnic with deep respirations Neuro: RASS -3, not F/C, minimal spontaneous movement, no withdrawal HEENT: Severe temporal wasting, NCAT, + sclericterus Cardiovascular: RRR, no M Lungs: no wheezes or other adventitious sounds anteriorly Abdomen: mildly distended, dull to percussion, diminished BS, NT Ext: warm, no edema, diminished DP pulses  LABS:  BMET Recent Labs  Lab 11/12/17 1030 11/13/17 0345 11/13/17 0847  NA 133* 136  --   K 5.7* 6.0* 6.2*  CL 102 108  --   CO2 19* 18*  --   BUN 59* 67*  --   CREATININE 2.39* 2.13*  --   GLUCOSE 77 67  --     Electrolytes Recent Labs  Lab 11/12/17 1030 11/13/17 0345  CALCIUM 7.5* 7.2*    CBC Recent Labs  Lab 11/12/17 1030 11/13/17 0345  WBC 9.2 8.1  HGB 7.5* 9.1*  HCT 22.5* 26.0*  PLT 119* 115*    Coag's Recent Labs  Lab 11/12/17 1626  INR 2.49    Sepsis Markers No results for input(s): LATICACIDVEN, PROCALCITON, O2SATVEN in the last 168 hours.  ABG No results  for input(s): PHART, PCO2ART, PO2ART in the last 168 hours.  Liver Enzymes Recent Labs  Lab 11/12/17 1030  AST 536*  522*  ALT 236*  227*  ALKPHOS 196*  193*  BILITOT 6.7*  6.4*  ALBUMIN 2.2*  2.1*    Cardiac Enzymes Recent Labs  Lab 11/12/17 1030  TROPONINI 0.06*    Glucose Recent Labs  Lab 11/12/17 1032 11/13/17 0824 11/13/17 0829 11/13/17 0917 11/13/17 1159  GLUCAP 95 43* 38* 247* 96    CXR: NACPD    ASSESSMENT / PLAN: Acute hepatic encephalopathy Hypotension AKI, nonoliguric Hyperkalemia Mild metabolic acidosis ESLD Hepatocellular carcinoma Severe protein-calorie  malnutrition Coagulopathy due to cirrhosis Mild thrombocytopenia Dysglycemia  Poor long term prognosis, limited goals of care  Agree with transfer to the ICU/SDU for hemodynamic monitoring and low-dose vasopressors as needed to maintain MAP >65 mmHg  I have changed his IV fluids to include bicarbonate which will treat hyperkalemia  I do not see a benefit to renal consultation and have told them that they can forego that consultation request  Continue lactulose enemas.  Transition to by mouth as able  I look in detail with patient's family and confirmed DNR status.  I expressed my limited hope for a favorable outcome.  Likewise, they have limited goals and seem very realistic.  We all agree that treating any form of discomfort will be our highest priority  Merton Border, MD PCCM service Mobile [phone] Pager [phone] 11/13/2017, 12:54 PM

## 2017-11-13 NOTE — Progress Notes (Signed)
Condition deteriorating with hypotension, tachycardia, with his liver cirrhosis, hepatic encephalopathy, acute renal failure with hyperkalemia worried about septic shock,need for pressors/ I spoke with Dr. Jamal Collin in ICU, we will transfer the patient to intensive care unit and start the patient on Levophed.  And I also spoke to patient's nurse Malka.

## 2017-11-13 NOTE — Progress Notes (Signed)
[facility] NAME: Richard Wood    MR#:  [id number]  DATE OF BIRTH:  20-Mar-1946  SUBJECTIVE: Admitted yesterday for and admitted yesterday for altered mental status and found to have hepatic encephalopathy, acute renal failure, this morning patient has hypoglycemia, hyperkalemia.  Patient is from [facility] and ,all his care is from there.  I told patient's daughter that we can request the medical records.  CHIEF COMPLAINT:   Chief Complaint  Patient presents with  . Altered Mental Status    REVIEW OF SYSTEMS:    ROS unable to obtain review of systems because patient is lethargic.  Nutrition:  Tolerating Diet: Tolerating PT:  Rectal tube, Foley.    DRUG ALLERGIES:   Allergies  Allergen Reactions  . Influenza Vaccine Recombinant Hives  . Other Hives    VITALS:  Blood pressure (!) 99/51, pulse (!) 107, temperature 98.1 F (36.7 C), temperature source Oral, resp. rate 15, height 5\' 11"  (1.803 m), weight 46.9 kg (103 lb 6.4 oz), SpO2 96 %.  PHYSICAL EXAMINATION:   Physical Exam  GENERAL:  72 y.o.-year-old patient lying in the bed lethargic, appears cachectic.   EYES: Pupils equal, round, reactive to light and accommodation.  HEENT: Head atraumatic, normocephalic. Oropharynx and nasopharynx clear.  NECK:  Supple, no jugular venous distention. No thyroid enlargement,  LUNGS: Normal breath sounds bilaterally, no wheezing, rales,rhonchi or crepitation. No use of accessory muscles of respiration.  CARDIOVASCULAR: S1, S2 normal. No murmurs, rubs, or gallops.  ABDOMEN: Soft, distended.  Bowel sounds present.   EXTREMITIES: No pedal edema, cyanosis, or clubbing.  NEUROLOGIC: Lethargic.  Unable to obtain neurological exam  because of lethargy.  PSYCHIATRIC:  ptis lethargic.  SKIN: No obvious rash, lesion, or ulcer.    LABORATORY PANEL:   CBC Recent Labs  Lab 11/13/17 0345  WBC 8.1  HGB 9.1*  HCT 26.0*  PLT 115*    ------------------------------------------------------------------------------------------------------------------  Chemistries  Recent Labs  Lab 11/12/17 1030 11/13/17 0345 11/13/17 0847  NA 133* 136  --   K 5.7* 6.0* 6.2*  CL 102 108  --   CO2 19* 18*  --   GLUCOSE 77 67  --   BUN 59* 67*  --   CREATININE 2.39* 2.13*  --   CALCIUM 7.5* 7.2*  --   AST 536*  522*  --   --   ALT 236*  227*  --   --   ALKPHOS 196*  193*  --   --   BILITOT 6.7*  6.4*  --   --    ------------------------------------------------------------------------------------------------------------------  Cardiac Enzymes Recent Labs  Lab 11/12/17 1030  TROPONINI 0.06*   ------------------------------------------------------------------------------------------------------------------  RADIOLOGY:  Ct Head Wo Contrast  Result Date: 11/12/2017 CLINICAL DATA:  Altered level of consciousness, holding the abdomen EXAM: CT HEAD WITHOUT CONTRAST TECHNIQUE: Contiguous axial images were obtained from the base of the skull through the vertex without intravenous contrast. COMPARISON:  CT brain scan of 12/02/2008 FINDINGS: Brain: The ventricular system remains somewhat prominent as are the cortical sulci, indicative of diffuse atrophy. The septum is midline in position. The fourth ventricle and basilar cisterns are stable. No hemorrhage, mass lesion, or acute infarction is seen. Benign-appearing bilateral basal ganglial calcifications are noted. Vascular: No vascular abnormality is seen on this unenhanced study. Skull: On bone window images, no calvarial abnormality is seen. Sinuses/Orbits: There is only a small amount of mucosal edema and debris within the left partition  of the sphenoid sinus. The remainder of the paranasal sinuses are well pneumatized. Other: None. IMPRESSION: 1. Atrophy.  No acute intracranial abnormality. 2. Mild left sphenoid sinus debris and mucosal thickening. Electronically Signed   By: Ivar Drape M.D.   On: 11/12/2017 12:05   Dg Abdomen Acute W/chest  Result Date: 11/12/2017 CLINICAL DATA:  Altered mental status. EXAM: DG ABDOMEN ACUTE W/ 1V CHEST COMPARISON:  12/19/2013. FINDINGS: Mediastinum and hilar structures are normal. Lungs are clear. No pleural effusion or pneumothorax. Solitary symmetric nodular opacities noted over both lung bases consistent with nipple shadows. EKG leads noted over the chest. Heart size normal. No acute bony abnormality. Air-filled loops of small and large bowel are noted suggesting adynamic ileus. Follow-up exam to demonstrate clearing and to exclude bowel obstruction suggested. No free air. Thoracolumbar spine scoliosis. IMPRESSION: 1.  No acute cardiopulmonary disease. 2. Distended loops of small and large bowel suggesting adynamic ileus. Follow-up exam suggested in order to demonstrate resolution and to exclude bowel obstruction. Electronically Signed   By: Marcello Moores  Register   On: 11/12/2017 11:44     ASSESSMENT AND PLAN:   Active Problems:   * No active hospital problems. *  #1/ hepatic encephalopathy: Patient is getting lactulose enemas.  Check ammonia level today.  Continue to be very lethargic.  We willGet an NG tube in, head CT unremarkable on admission.  #2. acute renal failure with severe hyperkalemia: Patient received potassium shifting measures with insulin, dextrose, will give calcium gluconate and also bicarb.  I spoke with Dr. Juleen China who is a nephrology on call today recommended an ultrasound, he will see the patient.  Not sure if he is having uremic encephalopathy in addition to hepatic encephalopathy.   3. history of hepatocellular carcinoma, liver cirrhosis.  Patient gets his care at around [place].  Requested medical records from [facility], seen by gastroenterology, recommended diagnostic paracentesis to rule out SBP and also CT abdomen.  The patient and her daughter told me that he had paracentesis 2 weeks ago and he did not show any  infection.  Patient at this time has hypoglycemia, hyperkalemia.  We will stabilize him first and then get an ultrasound to evaluate for paracentesis and also start empirically with Rocephin.  4.  Liver cirrhosis, coagulopathy with INR more than 2,.  Vitamin K.  5 prognosis really poor, with multiorgan failure with liver cirrhosis, now has hepatic encephalopathy, acute renal failure.  Discussed with patient's daughter.  She mentioned that he does not want him on artificial nutrition or CPR, ventilator. #6. anemia, thrombocytopenia due to liver cirrhosis.  He had 1 unit of packed RBC transfusion. #7/adynamic ileus: NG tube to be inserted, check portable x-ray of abdomen for follow-up.  Discussed the plan with patient's daughter she is okay with NG tube for treatment for couple of days. All the records are reviewed and case discussed with Care Management/Social Workerr. Management plans discussed with the patient, family and they are in agreement.  CODE STATUS: DNR  TOTAL TIME TAKING CARE OF THIS PATIENT: 45 minutes.   POSSIBLE D/C IN 3-5DAYS, DEPENDING ON CLINICAL CONDITION.   Epifanio Lesches M.D on 11/13/2017 at 9:44 AM  Between 7am to 6pm - Pager - [phone]  After 6pm go to [email or web address] - password [email or web address]  Office  [phone]  CC: Primary care physician; System, Pcp Not In

## 2017-11-13 NOTE — Progress Notes (Signed)
Richard Wood , MD [address], [place], [place], [place], [address] [address], [place], [place], [place], [address] Phone: [phone]  Fax: Richard Wood is being followed for cirrhosis of the liver  Day 1 of follow up   Subjective: Not in discomfort, appears comfortable , not communicating    Objective: Vital signs in last 24 hours: Vitals:   11/13/17 1113 11/13/17 1116 11/13/17 1120 11/13/17 1139  BP: 124/62 (!) 149/77 123/66 (!) 86/41  Pulse: 66 (!) 127 (!) 125 (!) 120  Resp:    18  Temp:      TempSrc:      SpO2: (!) 63% 100% (!) 62% 97%  Weight:      Height:       Weight change:   Intake/Output Summary (Last 24 hours) at 11/13/2017 1402 Last data filed at 11/13/2017 0160 Gross per 24 hour  Intake 2159.17 ml  Output 1000 ml  Net 1159.17 ml     Exam: Heart:: Regular rate and rhythm, S1S2 present or without murmur or extra heart sounds Lungs: normal, clear to auscultation and clear to auscultation and percussion Abdomen: soft, nontender, normal bowel sounds   Lab Results: @LABTEST2 @ Micro Results: No results found for this or any previous visit (from the past 240 hour(s)). Studies/Results: Ct Head Wo Contrast  Result Date: 11/12/2017 CLINICAL DATA:  Altered level of consciousness, holding the abdomen EXAM: CT HEAD WITHOUT CONTRAST TECHNIQUE: Contiguous axial images were obtained from the base of the skull through the vertex without intravenous contrast. COMPARISON:  CT brain scan of 12/02/2008 FINDINGS: Brain: The ventricular system remains somewhat prominent as are the cortical sulci, indicative of diffuse atrophy. The septum is midline in position. The fourth ventricle and basilar cisterns are stable. No hemorrhage, mass lesion, or acute infarction is seen. Benign-appearing bilateral basal ganglial calcifications are noted. Vascular: No vascular abnormality is seen on this unenhanced study. Skull: On bone window images, no calvarial  abnormality is seen. Sinuses/Orbits: There is only a small amount of mucosal edema and debris within the left partition of the sphenoid sinus. The remainder of the paranasal sinuses are well pneumatized. Other: None. IMPRESSION: 1. Atrophy.  No acute intracranial abnormality. 2. Mild left sphenoid sinus debris and mucosal thickening. Electronically Signed   By: Richard Wood M.D.   On: 11/12/2017 12:05   US Renal  Result Date: 11/13/2017 CLINICAL DATA:  Abdominal pain. EXAM: RENAL / URINARY TRACT ULTRASOUND COMPLETE COMPARISON:  Abdominal series 11/12/2017.  Ultrasound 06/09/2014. FINDINGS: Right Kidney: Length: 9.7 cm. Mild increased echogenicity. No hydronephrosis. No mass lesion. Left Kidney: Length: 9.5 cm. Mild increased echogenicity. No hydronephrosis. No mass lesion. Bladder: Foley catheter is present.  Bladder is nondistended. Incidental note is made of mild ascites IMPRESSION: 1. Mild increased echogenicity both kidneys. Chronic medical renal disease cannot be excluded. No acute renal abnormality. No hydronephrosis or bladder distention. Foley catheter present. 2.  Incidental note is made of mild ascites. Electronically Signed   By: Richard Wood  Register   On: 11/13/2017 11:51   US Abdomen Limited  Result Date: 11/13/2017 CLINICAL DATA:  72 year old with cirrhosis and confusion. Request for diagnostic paracentesis. EXAM: LIMITED ABDOMEN ULTRASOUND FOR ASCITES TECHNIQUE: Limited ultrasound survey for ascites was performed in all four abdominal quadrants. COMPARISON:  Renal ultrasound 11/13/2017 FINDINGS: There is a small amount of perihepatic ascites. However, the patient is very tachypneic and there was not a reliable safe percutaneous window for paracentesis. IMPRESSION: Small amount of perihepatic  ascites. No safe percutaneous window due to patient's rapid breathing and overall condition. Paracentesis not performed. Electronically Signed   By: Richard Wood M.D.   On: 11/13/2017 12:10   Dg Abdomen Acute  W/chest  Result Date: 11/12/2017 CLINICAL DATA:  Altered mental status. EXAM: DG ABDOMEN ACUTE W/ 1V CHEST COMPARISON:  12/19/2013. FINDINGS: Mediastinum and hilar structures are normal. Lungs are clear. No pleural effusion or pneumothorax. Solitary symmetric nodular opacities noted over both lung bases consistent with nipple shadows. EKG leads noted over the chest. Heart size normal. No acute bony abnormality. Air-filled loops of small and large bowel are noted suggesting adynamic ileus. Follow-up exam to demonstrate clearing and to exclude bowel obstruction suggested. No free air. Thoracolumbar spine scoliosis. IMPRESSION: 1.  No acute cardiopulmonary disease. 2. Distended loops of small and large bowel suggesting adynamic ileus. Follow-up exam suggested in order to demonstrate resolution and to exclude bowel obstruction. Electronically Signed   By: Richard Wood  Register   On: 11/12/2017 11:44   Medications: I have reviewed the patient's current medications. Scheduled Meds: . dextrose       Continuous Infusions: .  sodium bicarbonate  infusion 1000 mL     PRN Meds:.   Assessment: Active Problems:   * No active hospital problems. *   Richard Wood is a 72 y.o. y/o male with alcoholic cirrhosis of the liver with [place], unclear stage of [place] as his care is at the [place], admitted with confusion , likely hepatic encephelopathy, AKI, acute liver failure INR 2.49 . CT head shows no bleed. Not enough fluid for diagnostic paracentesis. Ileus. Recent use of cocaine and alcohol.   Plan  1.  vitamin K S.C 10 mg once a day for 3 days.  2. May have hepato renal syndrome- challenge with albumin 1.5 grams per KG x1 and if creatinine does not improve may need octreotide, midodrine and albumin administration.  3. Hepatic encephalopathy - correct AKI, avoid dehydration by administrating excess lactulose. Suggest NG tube with lactulose titrated to two soft bowel movements, correct electrolytes and avoid narcotics or  benzodiazepines. Screen for infection and treat .  4. Doppler liver to r/o portal vein thrmbosis . 5. With multiorgan failure- overall poor prognosis     LOS: 1 day   Richard Bellows, MD 11/13/2017, 2:02 PM

## 2017-11-13 NOTE — Progress Notes (Signed)
Pharmacy Antibiotic Note  JOANN KULPA is a 72 y.o. male admitted on 11/12/2017 with IAI .  Pharmacy has been consulted for ceftriaxone dosing.  Plan: Ceftriaxone 2 g IV Daily  Height: 5\' 11"  (180.3 cm) Weight: 103 lb 6.4 oz (46.9 kg) IBW/kg (Calculated) : 75.3  Temp (24hrs), Avg:97.9 F (36.6 C), Min:97.5 F (36.4 C), Max:98.2 F (36.8 C)  Recent Labs  Lab 11/12/17 1030 11/13/17 0345  WBC 9.2 8.1  CREATININE 2.39* 2.13*    Estimated Creatinine Clearance: 20.8 mL/min (A) (by C-G formula based on SCr of 2.13 mg/dL (H)).    Allergies  Allergen Reactions  . Influenza Vaccine Recombinant Hives  . Other Hives    Antimicrobials this admission: Ceftriaxone 3/1 >>  Dose adjustments this admission:  Microbiology results: None  Thank you for allowing pharmacy to be a part of this patient's care.  Lenis Noon, PharmD, BCPS Clinical Pharmacist 11/13/2017 10:51 AM

## 2017-11-14 ENCOUNTER — Inpatient Hospital Stay: Payer: Medicare Other

## 2017-11-14 DIAGNOSIS — E43 Unspecified severe protein-calorie malnutrition: Secondary | ICD-10-CM

## 2017-11-14 DIAGNOSIS — G9340 Encephalopathy, unspecified: Secondary | ICD-10-CM

## 2017-11-14 LAB — COMPREHENSIVE METABOLIC PANEL
ALBUMIN: 1.7 g/dL — AB (ref 3.5–5.0)
ALT: 182 U/L — AB (ref 17–63)
AST: 388 U/L — AB (ref 15–41)
Alkaline Phosphatase: 176 U/L — ABNORMAL HIGH (ref 38–126)
Anion gap: 8 (ref 5–15)
BILIRUBIN TOTAL: 6.3 mg/dL — AB (ref 0.3–1.2)
BUN: 60 mg/dL — AB (ref 6–20)
CO2: 23 mmol/L (ref 22–32)
Calcium: 7.2 mg/dL — ABNORMAL LOW (ref 8.9–10.3)
Chloride: 110 mmol/L (ref 101–111)
Creatinine, Ser: 1.66 mg/dL — ABNORMAL HIGH (ref 0.61–1.24)
GFR calc Af Amer: 46 mL/min — ABNORMAL LOW (ref >60)
GFR calc non Af Amer: 40 mL/min — ABNORMAL LOW (ref >60)
GLUCOSE: 138 mg/dL — AB (ref 65–99)
Potassium: 3.5 mmol/L (ref 3.5–5.1)
Sodium: 141 mmol/L (ref 135–145)
TOTAL PROTEIN: 7.4 g/dL (ref 6.5–8.1)

## 2017-11-14 LAB — GLUCOSE, CAPILLARY
Glucose-Capillary: 130 mg/dL — ABNORMAL HIGH (ref 65–99)
Glucose-Capillary: 118 mg/dL — ABNORMAL HIGH (ref 65–99)
Glucose-Capillary: 126 mg/dL — ABNORMAL HIGH (ref 65–99)

## 2017-11-14 LAB — CBC
HEMATOCRIT: 26.1 % — AB (ref 40.0–52.0)
HEMOGLOBIN: 9.1 g/dL — AB (ref 13.0–18.0)
MCH: 38.9 pg — ABNORMAL HIGH (ref 26.0–34.0)
MCHC: 34.8 g/dL (ref 32.0–36.0)
MCV: 111.7 fL — ABNORMAL HIGH (ref 80.0–100.0)
Platelets: 90 10*3/uL — ABNORMAL LOW (ref 150–440)
RBC: 2.34 MIL/uL — ABNORMAL LOW (ref 4.40–5.90)
RDW: 26.1 % — ABNORMAL HIGH (ref 11.5–14.5)
WBC: 13 10*3/uL — ABNORMAL HIGH (ref 3.8–10.6)

## 2017-11-14 LAB — AMMONIA: Ammonia: 60 umol/L — ABNORMAL HIGH (ref 9–35)

## 2017-11-14 MED ORDER — FREE WATER
100.0000 mL | Freq: Three times a day (TID) | Status: DC
  Administered 2017-11-14 – 2017-11-16 (×8): 100 mL

## 2017-11-14 MED ORDER — LACTULOSE 10 GM/15ML PO SOLN
30.0000 g | Freq: Two times a day (BID) | ORAL | Status: DC
  Administered 2017-11-14 – 2017-11-16 (×5): 30 g
  Filled 2017-11-14 (×6): qty 60

## 2017-11-14 MED ORDER — PANTOPRAZOLE SODIUM 40 MG PO PACK
40.0000 mg | PACK | Freq: Every day | ORAL | Status: DC
  Administered 2017-11-14 – 2017-11-16 (×3): 40 mg
  Filled 2017-11-14 (×4): qty 20

## 2017-11-14 MED ORDER — RIFAXIMIN 200 MG PO TABS
200.0000 mg | ORAL_TABLET | Freq: Three times a day (TID) | ORAL | Status: DC
  Administered 2017-11-14 – 2017-11-16 (×7): 200 mg
  Filled 2017-11-14 (×9): qty 1

## 2017-11-14 MED ORDER — VITAL HIGH PROTEIN PO LIQD
1000.0000 mL | ORAL | Status: DC
  Administered 2017-11-14: 1000 mL

## 2017-11-14 MED ORDER — FOLIC ACID 1 MG PO TABS
1.0000 mg | ORAL_TABLET | Freq: Every day | ORAL | Status: DC
  Administered 2017-11-14 – 2017-11-16 (×3): 1 mg
  Filled 2017-11-14 (×3): qty 1

## 2017-11-14 MED ORDER — ADULT MULTIVITAMIN LIQUID CH
15.0000 mL | Freq: Every day | ORAL | Status: DC
  Administered 2017-11-14 – 2017-11-16 (×3): 15 mL
  Filled 2017-11-14 (×3): qty 15

## 2017-11-14 NOTE — Progress Notes (Signed)
[facility] NAME: Bret Vanessen    MR#:  [id number]  DATE OF BIRTH:  [birth year]  SUBJECTIVE:  still lethargic.  Getting NG tube feeding.  Kidney function slightly better than yesterday.  Daughter at bedside.  CHIEF COMPLAINT:   Chief Complaint  Patient presents with  . Altered Mental Status    REVIEW OF SYSTEMS:    ROS unable to obtain review of systems because patient is lethargic.  Nutrition:  Tolerating Diet: Tolerating PT:  Rectal tube, Foley.    DRUG ALLERGIES:   Allergies  Allergen Reactions  . Influenza Vaccine Recombinant Hives  . Other Hives    VITALS:  Blood pressure (!) 151/73, pulse (!) 101, temperature 98.7 F (37.1 C), temperature source Oral, resp. rate 18, height 5\' 11"  (1.803 m), weight 46.9 kg (103 lb 6.4 oz), SpO2 99 %.  PHYSICAL EXAMINATION:   Physical Exam  GENERAL:  72 y.o.-year-old patient lying in the bed lethargic, appears cachectic.   EYES: Pupils equal, round, reactive to light and accommodation.  HEENT: Head atraumatic, normocephalic. Oropharynx and nasopharynx clear.  NECK:  Supple, no jugular venous distention. No thyroid enlargement,  LUNGS: Normal breath sounds bilaterally, no wheezing, rales,rhonchi or crepitation. No use of accessory muscles of respiration.  CARDIOVASCULAR: S1, S2 normal. No murmurs, rubs, or gallops.  ABDOMEN: Soft, distended.  Bowel sounds present.   EXTREMITIES: No pedal edema, cyanosis, or clubbing.  NEUROLOGIC: Lethargic.  Unable to obtain neurological exam  because of lethargy.  PSYCHIATRIC:  ptis lethargic.  SKIN: No obvious rash, lesion, or ulcer.    LABORATORY PANEL:   CBC Recent Labs  Lab 11/14/17 0523  WBC 13.0*  HGB 9.1*  HCT 26.1*  PLT 90*   ------------------------------------------------------------------------------------------------------------------  Chemistries  Recent Labs  Lab 11/14/17 0523  NA 141  K 3.5  CL 110   CO2 23  GLUCOSE 138*  BUN 60*  CREATININE 1.66*  CALCIUM 7.2*  AST 388*  ALT 182*  ALKPHOS 176*  BILITOT 6.3*   ------------------------------------------------------------------------------------------------------------------  Cardiac Enzymes Recent Labs  Lab 11/12/17 1030  TROPONINI 0.06*   ------------------------------------------------------------------------------------------------------------------  RADIOLOGY:  Dg Abd 1 View  Result Date: 11/14/2017 CLINICAL DATA:  72 year old male with a history of nasogastric tube placement EXAM: ABDOMEN - 1 VIEW COMPARISON:  None. FINDINGS: Limited frontal view of the abdomen performed. No significant airspace disease at the lung bases. Metallic tip enteric tube terminates in the body of the stomach. Gas within stomach, small bowel, colon, without abnormal distention. IMPRESSION: Metallic tip enteric feeding tube terminates in the stomach. Electronically Signed   By: Corrie Mckusick D.O.   On: 11/14/2017 10:35   US Renal  Result Date: 11/13/2017 CLINICAL DATA:  Abdominal pain. EXAM: RENAL / URINARY TRACT ULTRASOUND COMPLETE COMPARISON:  Abdominal series 11/12/2017.  Ultrasound 06/09/2014. FINDINGS: Right Kidney: Length: 9.7 cm. Mild increased echogenicity. No hydronephrosis. No mass lesion. Left Kidney: Length: 9.5 cm. Mild increased echogenicity. No hydronephrosis. No mass lesion. Bladder: Foley catheter is present.  Bladder is nondistended. Incidental note is made of mild ascites IMPRESSION: 1. Mild increased echogenicity both kidneys. Chronic medical renal disease cannot be excluded. No acute renal abnormality. No hydronephrosis or bladder distention. Foley catheter present. 2.  Incidental note is made of mild ascites. Electronically Signed   By: Marcello Moores  Register   On: 11/13/2017 11:51   US Abdomen Limited  Result Date: 11/13/2017 CLINICAL DATA:  72 year old with cirrhosis and confusion. Request for diagnostic paracentesis.  EXAM: LIMITED  ABDOMEN ULTRASOUND FOR ASCITES TECHNIQUE: Limited ultrasound survey for ascites was performed in all four abdominal quadrants. COMPARISON:  Renal ultrasound 11/13/2017 FINDINGS: There is a small amount of perihepatic ascites. However, the patient is very tachypneic and there was not a reliable safe percutaneous window for paracentesis. IMPRESSION: Small amount of perihepatic ascites. No safe percutaneous window due to patient's rapid breathing and overall condition. Paracentesis not performed. Electronically Signed   By: Markus Daft M.D.   On: 11/13/2017 12:10     ASSESSMENT AND PLAN:   Active Problems:   Protein-calorie malnutrition, severe  #1/ hepatic encephalopathy:  continue lactulose via NG tube along with tube feeding.  Ammonia level to be monitored today,  #2. acute renal failure with severe hyperkalemia: improving slowly.  Renal ultrasound l showing chronic medical renal disease.  No hydronephrosis.  Hyperkalemia improved.   3. history of hepatocellular carcinoma, liver cirrhosis.  Patient gets his care at around [place].  Requested medical records from [facility], seen by gastroenterology, patient received vitamin K.  Ultrasound abdomen did not show much fluid for paracentesis.  4.  Liver cirrhosis, coagulopathy with INR more than 2,.  Vitamin K.  He is given already.  5. prognosis really poor, with multiorgan failure with liver cirrhosis, now has hepatic encephalopathy, acute renal failure.  Discussed with patient's daughter.  She mentioned that he does not want him on artificial nutrition or CPR, ventilator discussed with patient's daughter also.. #6. anemia, thrombocytopenia due to liver cirrhosis.  He had 1 unit of packed RBC transfusion. #7/adynamic ileus: NG tube to be inserted, continue NG tube feedings.   All the records are reviewed and case discussed with Care Management/Social Workerr. Management plans discussed with the patient, family and they are in agreement.  CODE STATUS:  DNR  TOTAL TIME TAKING CARE OF THIS PATIENT: 45 minutes.   POSSIBLE D/C IN 3-5DAYS, DEPENDING ON CLINICAL CONDITION.   Epifanio Lesches M.D on 11/14/2017 at 11:58 AM  Between 7am to 6pm - Pager - [phone]  After 6pm go to [email or web address] - password [email or web address] [facility]  Office  [phone]  CC: Primary care physician; System, Pcp Not In

## 2017-11-14 NOTE — Clinical Social Work Note (Signed)
CSW attempted to meet with the patient at bedside to discuss options for residential substance use treatment. The patient was awake; however, he was not responsive. The patient had a visitor in the room who indicated that the patient had not been verbal today. CSW will continue to follow and attempt assessment once the patient is appropriate for such.  Santiago Bumpers, MSW, Latanya Presser [phone]

## 2017-11-14 NOTE — Progress Notes (Signed)
Lucilla Lame, MD [facility]   [address], [place], [place] [address] Phone: [phone] Fax : [phone]   Subjective: The patient has been transferred from the ICU to the floor.  The patient is still communicating.  The patient's daughter is in the room with the patient and states that he is looking better each day.   Objective: Vital signs in last 24 hours: Vitals:   11/14/17 1000 11/14/17 1030 11/14/17 1100 11/14/17 1141  BP: 131/76 134/74 132/85 (!) 151/73  Pulse: 99 100 (!) 101 (!) 101  Resp: 17 20 17 18   Temp: 99.7 F (37.6 C) 99.7 F (37.6 C) 99.9 F (37.7 C) 98.7 F (37.1 C)  TempSrc:    Oral  SpO2: 97% 97% 97% 99%  Weight:      Height:       Weight change:   Intake/Output Summary (Last 24 hours) at 11/14/2017 1800 Last data filed at 11/14/2017 1100 Gross per 24 hour  Intake 1125 ml  Output 1950 ml  Net -825 ml     Exam: Heart:: Regular rate and rhythm, S1S2 present or without murmur or extra heart sounds Lungs: normal and clear to auscultation and percussion Abdomen: soft, nontender, normal bowel sounds   Lab Results: @LABTEST2 @ Micro Results: Recent Results (from the past 240 hour(s))  MRSA PCR Screening     Status: None   Collection Time: 11/13/17  4:32 PM  Result Value Ref Range Status   MRSA by PCR NEGATIVE NEGATIVE Final    Comment:        The GeneXpert MRSA Assay (FDA approved for NASAL specimens only), is one component of a comprehensive MRSA colonization surveillance program. It is not intended to diagnose MRSA infection nor to guide or monitor treatment for MRSA infections. Performed at [facility], [address] [place], [place] [address]    Studies/Results: Dg Abd 1 View  Result Date: 11/14/2017 CLINICAL DATA:  72 year old male with a history of nasogastric tube placement EXAM: ABDOMEN - 1 VIEW COMPARISON:  None. FINDINGS: Limited frontal view of the abdomen performed. No significant airspace disease at the lung  bases. Metallic tip enteric tube terminates in the body of the stomach. Gas within stomach, small bowel, colon, without abnormal distention. IMPRESSION: Metallic tip enteric feeding tube terminates in the stomach. Electronically Signed   By: Corrie Mckusick D.O.   On: 11/14/2017 10:35   US Renal  Result Date: 11/13/2017 CLINICAL DATA:  Abdominal pain. EXAM: RENAL / URINARY TRACT ULTRASOUND COMPLETE COMPARISON:  Abdominal series 11/12/2017.  Ultrasound 06/09/2014. FINDINGS: Right Kidney: Length: 9.7 cm. Mild increased echogenicity. No hydronephrosis. No mass lesion. Left Kidney: Length: 9.5 cm. Mild increased echogenicity. No hydronephrosis. No mass lesion. Bladder: Foley catheter is present.  Bladder is nondistended. Incidental note is made of mild ascites IMPRESSION: 1. Mild increased echogenicity both kidneys. Chronic medical renal disease cannot be excluded. No acute renal abnormality. No hydronephrosis or bladder distention. Foley catheter present. 2.  Incidental note is made of mild ascites. Electronically Signed   By: Marcello Moores  Register   On: 11/13/2017 11:51   US Abdomen Limited  Result Date: 11/13/2017 CLINICAL DATA:  71 year old with cirrhosis and confusion. Request for diagnostic paracentesis. EXAM: LIMITED ABDOMEN ULTRASOUND FOR ASCITES TECHNIQUE: Limited ultrasound survey for ascites was performed in all four abdominal quadrants. COMPARISON:  Renal ultrasound 11/13/2017 FINDINGS: There is a small amount of perihepatic ascites. However, the patient is very tachypneic and there was not a reliable safe percutaneous window for paracentesis.  IMPRESSION: Small amount of perihepatic ascites. No safe percutaneous window due to patient's rapid breathing and overall condition. Paracentesis not performed. Electronically Signed   By: Markus Daft M.D.   On: 11/13/2017 12:10   Medications: I have reviewed the patient's current medications. Scheduled Meds: . feeding supplement (VITAL HIGH PROTEIN)  1,000 mL Per  Tube Q24H  . folic acid  1 mg Per Tube Daily  . free water  100 mL Per Tube Q8H  . lactulose  30 g Per Tube BID  . multivitamin  15 mL Per Tube Daily  . pantoprazole sodium  40 mg Per Tube Q1200  . phytonadione  10 mg Subcutaneous Daily  . rifaximin  200 mg Per Tube TID   Continuous Infusions: PRN Meds:.morphine injection   Assessment: Active Problems:   Protein-calorie malnutrition, severe    Plan: This patient has a history of cocaine use and cirrhosis with hepatocellular carcinoma.The patient's daughter also reports that he has hepatitis C and has refused treatment in the past. Nothing further to recommend from a GI point of view in this patient who has known cirrhosis with hepatocellular carcinoma.  I would recommend continued supportive treatment.   LOS: 2 days   Tenley Winward 11/14/2017, 6:00 PM

## 2017-11-15 DIAGNOSIS — K7460 Unspecified cirrhosis of liver: Secondary | ICD-10-CM

## 2017-11-15 LAB — CBC
HCT: 26.9 % — ABNORMAL LOW (ref 40.0–52.0)
Hemoglobin: 8.9 g/dL — ABNORMAL LOW (ref 13.0–18.0)
MCH: 38 pg — AB (ref 26.0–34.0)
MCHC: 33.1 g/dL (ref 32.0–36.0)
MCV: 114.7 fL — ABNORMAL HIGH (ref 80.0–100.0)
PLATELETS: 86 10*3/uL — AB (ref 150–440)
RBC: 2.34 MIL/uL — ABNORMAL LOW (ref 4.40–5.90)
RDW: 27.2 % — ABNORMAL HIGH (ref 11.5–14.5)
WBC: 10.2 10*3/uL (ref 3.8–10.6)

## 2017-11-15 LAB — GLUCOSE, CAPILLARY
GLUCOSE-CAPILLARY: 111 mg/dL — AB (ref 65–99)
GLUCOSE-CAPILLARY: 115 mg/dL — AB (ref 65–99)
GLUCOSE-CAPILLARY: 89 mg/dL (ref 65–99)
Glucose-Capillary: 96 mg/dL (ref 65–99)
Glucose-Capillary: 99 mg/dL (ref 65–99)

## 2017-11-15 LAB — BASIC METABOLIC PANEL
Anion gap: 7 (ref 5–15)
BUN: 48 mg/dL — AB (ref 6–20)
CO2: 25 mmol/L (ref 22–32)
CREATININE: 1.15 mg/dL (ref 0.61–1.24)
Calcium: 7.5 mg/dL — ABNORMAL LOW (ref 8.9–10.3)
Chloride: 112 mmol/L — ABNORMAL HIGH (ref 101–111)
GFR calc Af Amer: >60 mL/min (ref >60)
GFR calc non Af Amer: >60 mL/min (ref >60)
GLUCOSE: 117 mg/dL — AB (ref 65–99)
Potassium: 3.3 mmol/L — ABNORMAL LOW (ref 3.5–5.1)
Sodium: 144 mmol/L (ref 135–145)

## 2017-11-15 LAB — HEPATIC FUNCTION PANEL
ALT: 143 U/L — ABNORMAL HIGH (ref 17–63)
AST: 291 U/L — AB (ref 15–41)
Albumin: 1.6 g/dL — ABNORMAL LOW (ref 3.5–5.0)
Alkaline Phosphatase: 170 U/L — ABNORMAL HIGH (ref 38–126)
BILIRUBIN DIRECT: 4.5 mg/dL — AB (ref 0.1–0.5)
BILIRUBIN TOTAL: 8.5 mg/dL — AB (ref 0.3–1.2)
Indirect Bilirubin: 4 mg/dL — ABNORMAL HIGH (ref 0.3–0.9)
Total Protein: 7.1 g/dL (ref 6.5–8.1)

## 2017-11-15 LAB — PROTIME-INR
INR: 2.21
PROTHROMBIN TIME: 24.3 s — AB (ref 11.4–15.2)

## 2017-11-15 LAB — MAGNESIUM: Magnesium: 3.1 mg/dL — ABNORMAL HIGH (ref 1.7–2.4)

## 2017-11-15 LAB — PHOSPHORUS: Phosphorus: 2.8 mg/dL (ref 2.5–4.6)

## 2017-11-15 MED ORDER — ORAL CARE MOUTH RINSE
15.0000 mL | Freq: Two times a day (BID) | OROMUCOSAL | Status: DC
  Administered 2017-11-16: 11:00:00 15 mL via OROMUCOSAL

## 2017-11-15 MED ORDER — CHLORHEXIDINE GLUCONATE 0.12 % MT SOLN
15.0000 mL | Freq: Two times a day (BID) | OROMUCOSAL | Status: DC
  Administered 2017-11-16: 15 mL via OROMUCOSAL
  Filled 2017-11-15: qty 15

## 2017-11-15 MED ORDER — VITAMIN B-1 100 MG PO TABS
100.0000 mg | ORAL_TABLET | Freq: Every day | ORAL | Status: DC
  Administered 2017-11-15 – 2017-11-16 (×2): 100 mg
  Filled 2017-11-15 (×2): qty 1

## 2017-11-15 MED ORDER — LORAZEPAM 2 MG/ML IJ SOLN
1.0000 mg | INTRAMUSCULAR | Status: DC | PRN
  Administered 2017-11-15 – 2017-11-16 (×2): 1 mg via INTRAVENOUS
  Filled 2017-11-15 (×2): qty 1

## 2017-11-15 MED ORDER — OSMOLITE 1.2 CAL PO LIQD
1000.0000 mL | ORAL | Status: DC
  Administered 2017-11-15 – 2017-11-16 (×3): 1000 mL

## 2017-11-15 NOTE — Progress Notes (Signed)
Nutrition Follow-up  DOCUMENTATION CODES:   Severe malnutrition in context of chronic illness, Underweight  INTERVENTION:  Recommend initiating Osmolite 1.2 at 20 mL/hr and advancing by 20 mL/hr Q8hrs to goal regimen of Osmolite 1.2 at 60 mL/hr via NGT. Provides 1778 kcal, 80 grams of protein, 1181 mL H2O daily.  Monitor magnesium, potassium, and phosphorus daily for at least 3 days, MD to replete as needed, as pt is at risk for refeeding syndrome given severe malnutrition.  Continue folic acid 1 mg daily per tube and liquid MVI daily per tube. Also provide thiamine 100 mg daily per tube.  Continue free water flush of 100 mL Q8hrs. Provides 1481 mL H2O daily including water in tube feeding.  Measure daily weights while patient is on tube feeding to help with assessing volume status.  NUTRITION DIAGNOSIS:   Severe Malnutrition related to chronic illness(HCC, COPD, cirrhosis) as evidenced by severe fat depletion, severe muscle depletion.  Ongoing.  GOAL:   Patient will meet greater than or equal to 90% of their needs  Progressing with adjustment of tube feeds to meet patient's calorie/protein needs.  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 -Continues to receive lactulose in setting of hepatic encephalopathy.  Met with patient and his daughter at bedside. Patient seems comfortable. He is awake but is still unable to communicate. NGT in place with tube feeds infusing. RN reports patient has been tolerating tube feeds. Abdomen feels soft on exam. Daughter reports he has not been eating  well for a while PTA. He has never weighed more than 150 lbs during his adult life as he was always thin.   Access: 10 Fr. NGT with weighted Dobbhoff tip placed 3/2; terminates in stomach per abdominal x-ray 3/2; 53 cm at right nare  TF: pt tolerating Vital High Protein at 20 mL/hr  Medications reviewed and include: folic acid 1 mg daily per tube, free water flush 100 mL Q8hrs per tube, lactulose 30 grams BID per tube, liquid MVI daily per tube, pantoprazole, vitamin K 10 mg daily [place], morphine PRN.  Labs reviewed: CBG 89-130, Potassium 3.3, Chloride 112, BUN 48, Phosphorus 2.8, Magnesium 3.1.  I/O: 1000 mL UOP yesterday (0.9 mL/kg/hr)  Discussed with RN.  Diet Order:  No diet orders on file  EDUCATION NEEDS:   Not appropriate for education at this time  Skin:  Skin Assessment: Reviewed RN Assessment  Last BM:  11/14/2017 - 100 mL in rectal tube  Height:   Ht Readings from Last 1 Encounters:  11/12/17 _0  (1.803 m)    Weight:   Wt Readings from Last 1 Encounters:  11/12/17 103 lb 6.4 oz (46.9 kg)    Ideal Body Weight:  78.18 kg  BMI:  Body mass index is 14.42 kg/m.  Estimated Nutritional Needs:   Kcal:  1620-1870 (MSJ x 1.3-1.5)  Protein:  70-85 grams (1.5-1.8 grams/kg)  Fluid:  1.4-1.6 L/day (30-35 mL/kg)  Willey Blade, MS, RD, LDN Office: [phone] Pager: [phone] After Hours/Weekend Pager: [phone]

## 2017-11-15 NOTE — Progress Notes (Signed)
  Lucilla Lame, MD [facility]   [address], [place], [place] [address] Phone: [phone] Fax : [phone]   Subjective: The patient is not able to give much history.  He is more agitated but more alert than yesterday.  Patient has a history of substance abuse and cirrhosis with hepatocellular carcinoma.  He is being treated for possible hepatic encephalopathy.   Objective: Vital signs in last 24 hours: Vitals:   11/14/17 1100 11/14/17 1141 11/14/17 1923 11/15/17 0400  BP: 132/85 (!) 151/73 126/67 (!) 150/74  Pulse: (!) 101 (!) 101 (!) 104 99  Resp: 17 18 16 16   Temp: 99.9 F (37.7 C) 98.7 F (37.1 C)  98.5 F (36.9 C)  TempSrc:  Oral  Oral  SpO2: 97% 99% 93% 95%  Weight:      Height:       Weight change:   Intake/Output Summary (Last 24 hours) at 11/15/2017 1319 Last data filed at 11/15/2017 1140 Gross per 24 hour  Intake 1216 ml  Output 650 ml  Net 566 ml     Exam: Heart:: Regular rate and rhythm, S1S2 present or without murmur or extra heart sounds Lungs: normal and clear to auscultation and percussion Abdomen: soft, nontender, normal bowel sounds   Lab Results: @LABTEST2 @ Micro Results: Recent Results (from the past 240 hour(s))  MRSA PCR Screening     Status: None   Collection Time: 11/13/17  4:32 PM  Result Value Ref Range Status   MRSA by PCR NEGATIVE NEGATIVE Final    Comment:        The GeneXpert MRSA Assay (FDA approved for NASAL specimens only), is one component of a comprehensive MRSA colonization surveillance program. It is not intended to diagnose MRSA infection nor to guide or monitor treatment for MRSA infections. Performed at [facility], [address] [place], [place] [address]    Studies/Results: Dg Abd 1 View  Result Date: 11/14/2017 CLINICAL DATA:  72 year old male with a history of nasogastric tube placement EXAM: ABDOMEN - 1 VIEW COMPARISON:  None. FINDINGS: Limited frontal view of the abdomen performed. No  significant airspace disease at the lung bases. Metallic tip enteric tube terminates in the body of the stomach. Gas within stomach, small bowel, colon, without abnormal distention. IMPRESSION: Metallic tip enteric feeding tube terminates in the stomach. Electronically Signed   By: Corrie Mckusick D.O.   On: 11/14/2017 10:35   Medications: I have reviewed the patient's current medications. Scheduled Meds: . folic acid  1 mg Per Tube Daily  . free water  100 mL Per Tube Q8H  . lactulose  30 g Per Tube BID  . multivitamin  15 mL Per Tube Daily  . pantoprazole sodium  40 mg Per Tube Q1200  . rifaximin  200 mg Per Tube TID  . thiamine  100 mg Per Tube Daily   Continuous Infusions: . feeding supplement (OSMOLITE 1.2 CAL) 1,000 mL (11/15/17 1140)   PRN Meds:.morphine injection   Assessment: Active Problems:   Protein-calorie malnutrition, severe   Encephalopathy    Plan: The patient has a history of cirrhosis with hepatocellular carcinoma and cocaine abuse.  The patient continues to have altered mental status although he is more alert today he is clearly more agitated.  I would continue to advise conservative treatment with his lactulose and feeding.  Nothing further to add from a GI point of view.   LOS: 3 days   Courtland Coppa 11/15/2017, 1:19 PM

## 2017-11-15 NOTE — Progress Notes (Signed)
[place] [facility] [place] NAME: Richard Wood    MR#:  [id number]  DATE OF BIRTH:  08-10-1946  Slightly more awake.  But unable to get any history from him.  CHIEF COMPLAINT:   Chief Complaint  Patient presents with  . Altered Mental Status    REVIEW OF SYSTEMS:    ROS unable to obtain review of systems because patient is lethargic.  Nutrition:  Tolerating Diet: Tolerating PT:  Rectal tube, Foley.    DRUG ALLERGIES:   Allergies  Allergen Reactions  . Influenza Vaccine Recombinant Hives  . Other Hives    VITALS:  Blood pressure (!) 150/74, pulse 99, temperature 98.5 F (36.9 C), temperature source Oral, resp. rate 16, height 5\' 11"  (1.803 m), weight 46.9 kg (103 lb 6.4 oz), SpO2 95 %.  PHYSICAL EXAMINATION:   Physical Exam  GENERAL:  72 y.o.-year-old patient lying in the bed, appears cachectic.   EYES: Pupils equal, round, reactive to light and accommodation.  HEENT: Head atraumatic, normocephalic. Oropharynx and nasopharynx clear.  NECK:  Supple, no jugular venous distention. No thyroid enlargement,  LUNGS: Normal breath sounds bilaterally, no wheezing, rales,rhonchi or crepitation. No use of accessory muscles of respiration.  CARDIOVASCULAR: S1, S2 normal. No murmurs, rubs, or gallops.  ABDOMEN: Soft, distended.  Bowel sounds present.   EXTREMITIES: No pedal edema, cyanosis, or clubbing.  NEUROLOGIC: Lethargic.  Unable to obtain neurological exam  because of lethargy.  PSYCHIATRIC:  ptis lethargic.  SKIN: No obvious rash, lesion, or ulcer.    LABORATORY PANEL:   CBC Recent Labs  Lab 11/15/17 0454  WBC 10.2  HGB 8.9*  HCT 26.9*  PLT 86*   ------------------------------------------------------------------------------------------------------------------  Chemistries  Recent Labs  Lab 11/15/17 0454  NA 144  K 3.3*  CL 112*  CO2 25  GLUCOSE 117*  BUN 48*  CREATININE 1.15  CALCIUM 7.5*  MG 3.1*  AST  291*  ALT 143*  ALKPHOS 170*  BILITOT 8.5*   ------------------------------------------------------------------------------------------------------------------  Cardiac Enzymes Recent Labs  Lab 11/12/17 1030  TROPONINI 0.06*   ------------------------------------------------------------------------------------------------------------------  RADIOLOGY:  Dg Abd 1 View  Result Date: 11/14/2017 CLINICAL DATA:  72 year old male with a history of nasogastric tube placement EXAM: ABDOMEN - 1 VIEW COMPARISON:  None. FINDINGS: Limited frontal view of the abdomen performed. No significant airspace disease at the lung bases. Metallic tip enteric tube terminates in the body of the stomach. Gas within stomach, small bowel, colon, without abnormal distention. IMPRESSION: Metallic tip enteric feeding tube terminates in the stomach. Electronically Signed   By: Corrie Mckusick D.O.   On: 11/14/2017 10:35   US Renal  Result Date: 11/13/2017 CLINICAL DATA:  Abdominal pain. EXAM: RENAL / URINARY TRACT ULTRASOUND COMPLETE COMPARISON:  Abdominal series 11/12/2017.  Ultrasound 06/09/2014. FINDINGS: Right Kidney: Length: 9.7 cm. Mild increased echogenicity. No hydronephrosis. No mass lesion. Left Kidney: Length: 9.5 cm. Mild increased echogenicity. No hydronephrosis. No mass lesion. Bladder: Foley catheter is present.  Bladder is nondistended. Incidental note is made of mild ascites IMPRESSION: 1. Mild increased echogenicity both kidneys. Chronic medical renal disease cannot be excluded. No acute renal abnormality. No hydronephrosis or bladder distention. Foley catheter present. 2.  Incidental note is made of mild ascites. Electronically Signed   By: Marcello Moores  Register   On: 11/13/2017 11:51   US Abdomen Limited  Result Date: 11/13/2017 CLINICAL DATA:  72 year old with cirrhosis and confusion. Request for diagnostic paracentesis. EXAM: LIMITED ABDOMEN ULTRASOUND FOR ASCITES TECHNIQUE:  Limited ultrasound survey for  ascites was performed in all four abdominal quadrants. COMPARISON:  Renal ultrasound 11/13/2017 FINDINGS: There is a small amount of perihepatic ascites. However, the patient is very tachypneic and there was not a reliable safe percutaneous window for paracentesis. IMPRESSION: Small amount of perihepatic ascites. No safe percutaneous window due to patient's rapid breathing and overall condition. Paracentesis not performed. Electronically Signed   By: Markus Daft M.D.   On: 11/13/2017 12:10     ASSESSMENT AND PLAN:   Active Problems:   Protein-calorie malnutrition, severe   Encephalopathy  #1/ hepatic encephalopathy:  continue lactulose via NG tube along with tube feeding.  Ammonia level slowly coming down but patient is at high risk for clinical duration secondary to underlying hepatocellular carcinoma, acute renal failure, severe protein calorie malnutrition.  Continue NG tube for lactulose and also feeding. #2. acute renal failure with severe hyperkalemia: improving slowly.  Renal ultrasound l showing chronic medical renal disease.  No hydronephrosis.  Hyperkalemia improved.    3. history of hepatocellular carcinoma, liver cirrhosis.  Patient gets his care at around [place].  Requested medical records from [facility], seen by gastroenterology, patient received vitamin K.  Ultrasound abdomen did not show much fluid for paracentesis.   INR is still around 2.2.  4.  Liver cirrhosis, coagulopathy with INR more than 2,.  Vitamin K.  He is given already.  5. prognosis really poor, with multiorgan failure with liver cirrhosis, now has hepatic encephalopathy, acute renal failure.  Discussed with patient's daughter.  She mentioned that he does not want him on artificial nutrition or CPR, ventilator discussed with patient's daughter also.  Palliative care consult t consult appreciated and patient may need to be evaluated for hospice at home or hospice home.. #6. anemia, thrombocytopenia due to liver cirrhosis.   He had 1 unit of packed RBC transfusion. #7/adynamic ileus: NG tube to be inserted, continue NG tube feedings.  #8 severe malnutrition in the context of chronic illness. All the records are reviewed and case discussed with Care Management/Social Workerr. Management plans discussed with the patient, family and they are in agreement.  CODE STATUS: DNR  TOTAL TIME TAKING CARE OF THIS PATIENT: 45 minutes.   POSSIBLE D/C IN 3-5DAYS, DEPENDING ON CLINICAL CONDITION.   Epifanio Lesches M.D on 11/15/2017 at 9:43 AM  Between 7am to 6pm - Pager - [phone]  After 6pm go to [email or web address] - password [email or web address] [facility]  Office  [phone]  CC: Primary care physician; System, Pcp Not In

## 2017-11-16 DIAGNOSIS — Z7189 Other specified counseling: Secondary | ICD-10-CM

## 2017-11-16 DIAGNOSIS — Z515 Encounter for palliative care: Secondary | ICD-10-CM

## 2017-11-16 DIAGNOSIS — E7220 Disorder of urea cycle metabolism, unspecified: Secondary | ICD-10-CM

## 2017-11-16 LAB — GLUCOSE, CAPILLARY
GLUCOSE-CAPILLARY: 43 mg/dL — AB (ref 65–99)
GLUCOSE-CAPILLARY: 154 mg/dL — AB (ref 65–99)
GLUCOSE-CAPILLARY: 134 mg/dL — AB (ref 65–99)
Glucose-Capillary: 120 mg/dL — ABNORMAL HIGH (ref 65–99)
Glucose-Capillary: 130 mg/dL — ABNORMAL HIGH (ref 65–99)
Glucose-Capillary: 135 mg/dL — ABNORMAL HIGH (ref 65–99)

## 2017-11-16 LAB — AMMONIA: Ammonia: 79 umol/L — ABNORMAL HIGH (ref 9–35)

## 2017-11-16 MED ORDER — MORPHINE SULFATE (CONCENTRATE) 10 MG/0.5ML PO SOLN: 10.0000 mg | ORAL | Status: DC | PRN

## 2017-11-16 MED ORDER — LORAZEPAM 2 MG/ML PO CONC: 0.5000 mg | ORAL | Status: DC | PRN

## 2017-11-16 MED ORDER — MORPHINE SULFATE (CONCENTRATE) 10 MG/0.5ML PO SOLN: 10.0000 mg | ORAL | 0 refills | Status: AC | PRN

## 2017-11-16 MED ORDER — LORAZEPAM 2 MG/ML PO CONC: 0.5000 mg | ORAL | 0 refills | Status: AC | PRN

## 2017-11-16 NOTE — Clinical Social Work Note (Signed)
CSW received consult that patient needs hospice facility placement, and they would like to go to [facility] and [facility]  CSW contacted [facility] and they can accept patient today.  Physician was notified, and will complete discharge summary.  Patient to be d/c'ed today to [facility].  Patient and family agreeable to plans will transport via [facility] hosp[facility] RN to call report.  Evette Cristal, MSW, [place]

## 2017-11-16 NOTE — Care Management Important Message (Signed)
Important Message  Patient Details  Name: Richard Wood MRN: [id number] Date of Birth: 03-Apr-1946   Medicare Important Message Given:  Yes    Shelbie Ammons, RN 11/16/2017, 6:32 AM

## 2017-11-16 NOTE — Consult Note (Signed)
Consultation Note Date: 11/16/2017   Patient Name: Richard Wood  DOB: 03-05-1946  MRN: [id number]  Age / Sex: 72 y.o., male  PCP: System, Pcp Not In Referring Physician: Epifanio Lesches, MD  Reason for Consultation: Establishing goals of care and Withdrawal of life-sustaining treatment  HPI/Patient Profile: Richard Wood  is a 72 y.o. male with a known history of asthma, liver cirrhosis, COPD and liver cirrhosis who was brought to the from the house due to altered mental status.  Patient is noted to have acute renal failure anemia and ammonia this very high.    Clinical Assessment and Goals of Care: Richard Wood is resting in bed with a NGT in place. One of his daughters who is a POA is at bedside.  She states that her other sister is not available and does not live in the area, and her brother who they found out was a brother 7 years ago lives in [place].  She states that she has been in contact with them and discussing her father status.  She states that he has been going to the [place] for his cirrhosis and hepatocellular carcinoma.  She states that he has a collection of gowns at home where he leaves AMA.  She states that he did not like medical treatment.   She states that prior to a month ago he was very active and walked a lot he also began drinking ensures to help his nutritional status.  We discussed diagnosis, prognosis, GOC, EOL wishes disposition and options.  A detailed discussion was had today regarding advanced directives.  Concepts specific to code status, artifical feeding and hydration,  IV antibiotics and rehospitalization was had.  The difference between an aggressive medical intervention path and a hospice comfort care path was discussed.   She states that she has had many conversations with her father on the way home from the [place].  She states that he stated to her that if he were declining that he  did not want any more pain and just to make him comfortable.  At this time she would like to make him comfort care and move him to the hospice facility.  MOST form completed with a DNR status and comfort care measures.    SUMMARY OF RECOMMENDATIONS    Transfer to the hospice facility with comfort measures.   Code Status/Advance Care Planning:  DNR    Symptom Management:   Morphine for pain  Ativan for anxiety and agitation.  Palliative Prophylaxis:   Oral Care  Additional Recommendations (Limitations, Scope, Preferences):  Full Comfort Care  Prognosis:  < 2 weeks cirrhosis, hepatocellular cellular carcinoma, albumin 1.6, severe protein calorie malnutrition. Discharge Planning: Hospice facility      Primary Diagnoses: Present on Admission: **None**   I have reviewed the medical record, interviewed the patient and family, and examined the patient. The following aspects are pertinent.  Past Medical History:  Diagnosis Date  . Asthma   . Cirrhosis ([place])   . COPD (chronic obstructive pulmonary disease) ([place])   .  Liver cancer [facility])    Social History   Socioeconomic History  . Marital status: Divorced    Spouse name: None  . Number of children: None  . Years of education: None  . Highest education level: None  Social Needs  . Financial resource strain: None  . Food insecurity - worry: None  . Food insecurity - inability: None  . Transportation needs - medical: None  . Transportation needs - non-medical: None  Occupational History  . None  Tobacco Use  . Smoking status: Current Every Day Smoker    Packs/day: 0.25    Types: Cigarettes  . Smokeless tobacco: Never Used  Substance and Sexual Activity  . Alcohol use: No    Comment: Last Drink- 09/2015 - Prior Heavy ETOH Use  . Drug use: Yes    Types: Cocaine, Marijuana    Comment: Last Use- 07/2016 per patient  . Sexual activity: None  Other Topics Concern  . None  Social History Narrative  . None    Family History  Problem Relation Age of Onset  . Heart disease Father    Scheduled Meds: . chlorhexidine  15 mL Mouth Rinse BID   Continuous Infusions: PRN Meds:.LORazepam, morphine injection Medications Prior to Admission:  Prior to Admission medications   Medication Sig Start Date End Date Taking? Authorizing Provider  albuterol (PROVENTIL HFA;VENTOLIN HFA) 108 (90 Base) MCG/ACT inhaler Inhale 1 puff into the lungs 4 (four) times daily as needed for wheezing or shortness of breath.   Yes [provider]  budesonide-formoterol (SYMBICORT) 160-4.5 MCG/ACT inhaler Inhale 2 puffs into the lungs 2 (two) times daily.   Yes [provider]  ciprofloxacin (CIPRO) 500 MG tablet Take 500 mg by mouth daily.   Yes [provider]  FUROSEMIDE PO Take 40mg  by mouth daily and 20mg  by mouth at bedtime   Yes [provider]  guaiFENesin (MUCINEX) 600 MG 12 hr tablet Take 600 mg by mouth daily as needed for cough.   Yes [provider]  lactulose, encephalopathy, (CHRONULAC) 10 GM/15ML SOLN Take 10 g by mouth daily as needed.   Yes [provider]  Melatonin 3 MG TABS Take 6 mg by mouth at bedtime.   Yes [provider]  Multiple Vitamins-Minerals (MULTIVITAMIN WITH MINERALS) tablet Take 1 tablet by mouth daily.   Yes [provider]  omeprazole (PRILOSEC) 20 MG capsule Take 20 mg by mouth every morning.   Yes [provider]  polyethylene glycol (MIRALAX / GLYCOLAX) packet Take 17 g by mouth 2 (two) times daily.   Yes [provider]  rifaximin (XIFAXAN) 550 MG TABS tablet Take 550 mg by mouth 2 (two) times daily. 11/05/17 12/05/17 Yes [provider]  sildenafil (VIAGRA) 100 MG tablet Take 100 mg by mouth daily as needed for erectile dysfunction.   Yes [provider]  spironolactone (ALDACTONE) 50 MG tablet Take 50 mg by mouth daily.   Yes [provider]  zinc sulfate 220 (50 Zn) MG  capsule Take 220 mg by mouth daily.   Yes [provider]   Allergies  Allergen Reactions  . Influenza Vaccine Recombinant Hives  . Other Hives   Review of Systems  Unable to perform ROS   Physical Exam  Constitutional: No distress.  Very thin and frail.   HENT:  Dobbhoff tube in place  Pulmonary/Chest: Effort normal.  Skin: Skin is warm and dry.    Vital Signs: BP 132/81 (BP Location: Left Arm)  Pulse (!) 108   Temp 99.9 F (37.7 C) (Axillary)   Resp 18   Ht 5\' 11"  (1.803 m)   Wt 48.8 kg (107 lb 8 oz)   SpO2 96%   BMI 14.99 kg/m  Pain Assessment: PAINAD POSS *See Group Information*: 1-Acceptable,Awake and alert     SpO2: SpO2: 96 % O2 Device:SpO2: 96 % O2 Flow Rate: .   IO: Intake/output summary:   Intake/Output Summary (Last 24 hours) at 11/16/2017 1505 Last data filed at 11/16/2017 0518 Gross per 24 hour  Intake 765.33 ml  Output 1325 ml  Net -559.67 ml    LBM: Last BM Date: 11/15/17 Baseline Weight: Weight: 46.9 kg (103 lb 6.4 oz) Most recent weight: Weight: 48.8 kg (107 lb 8 oz)     Palliative Assessment/Data:     Time In: 2:10 Time Out: 3:20 Time Total: 70 min Greater than 50%  of this time was spent counseling and coordinating care related to the above assessment and plan.  Signed by: Asencion Gowda, NP   Please contact Palliative Medicine Team phone at [phone] for questions and concerns.  For individual provider: See Shea Evans

## 2017-11-16 NOTE — Progress Notes (Signed)
New hospice home referral received from [facility] following a Palliative Medicine consult. Patient is a 72 year old man with known history of hepatocellular cancer, liver cirrhosis and COPD admitted to [facility] on 2/28 with alter mental status. He was found at home by his family disoriented and brought to the ER. In the ED he was found to be in acute renal failure as well as an elevated ammonia level. He has continued to decline despite medical intervention, he is not eating and has required placement of an NG tube for feeding and medication administration. Palliative medicine was consulted for goals of care and met with patient's daughter Ojas Coone today. She has chosen to focus on his comfort, with transfer to the hospice home for end of life care. Patient has required IV lorazepam and IV morphine for management of pain and anxiety. Writer met in the room with Joelene Millin to initiate education regarding hospice services, philosophy and team approach to care with good understanding voiced. Questions answered, consents signed. Patient information faxed to referral. Report called to the [facility], EMS notified for transport. Family and hospital care team updated. Flo Shanks RN, BSN, [place] and [facility], hospital Liaison [phone]

## 2017-11-16 NOTE — Discharge Summary (Signed)
Richard Wood, is a 72 y.o. male  DOB 10/15/1945  MRN [id number].  Admission date:  11/12/2017  Admitting Physician  Dustin Flock, MD  Discharge Date:  11/16/2017   Primary MD  System, Pcp Not In  Recommendations for primary care physician for things to follow:   Patient is going to hospice home when the bed is available.   Admission Diagnosis  Delirium [R41.0] Hyperammonemia (HCC) [E72.20] Gastrointestinal hemorrhage, unspecified gastrointestinal hemorrhage type [K92.2] Anemia, unspecified type [D64.9] Renal failure, unspecified chronicity [N19]   Discharge Diagnosis  Delirium [R41.0] Hyperammonemia ([place]) [E72.20] Gastrointestinal hemorrhage, unspecified gastrointestinal hemorrhage type [K92.2] Anemia, unspecified type [D64.9] Renal failure, unspecified chronicity [N19]   Active Problems:   Protein-calorie malnutrition, severe   Encephalopathy   Hepatic cirrhosis ([place])      Past Medical History:  Diagnosis Date  . Asthma   . Cirrhosis ([place])   . COPD (chronic obstructive pulmonary disease) ([place])   . Liver cancer [facility])     Past Surgical History:  Procedure Laterality Date  . ANKLE SURGERY Left 1980's  . LIVER SURGERY  11/2015   [employer] [facility]       History of present illness and  Hospital Course:     Kindly see H&P for history of present illness and admission details, please review complete Labs, Consult reports and Test reports for all details in brief  HPI  from the history and physical done on the day of admission 72 year old male patient with history of liver cirrhosis, hepatocellular carcinoma, COPD admitted for altered mental status, acute renal failure, hepatic encephalopathy.   Hospital Course   #1 altered mental status secondary to hepatic encephalopathy, patient admitted to medical  service, her ammonia was very high when he came,ammonia level is 133 and he came.  Patient received lactulose enemas, transferred to ICU because of hypoglycemia, hypotension. #2. acute renal failure with severe hyperkalemia, patient received potassium safety measures, gentle hydration.  Renal function improved, potassium got corrected, potassium was 6.1 and decreased to 4.8.  Regardless patient has multiple medical problems of liver cirrhosis, hepatocellular carcinoma, patient is still lethargic, patient received NG tube feedings for the last 3 days.  But based despite medical treatment patient has underlying liver disease, hepatocellular carcinoma and patient still lethargic and the condition did not improve.  Patient family met with palliative care team, decided that they want him on comfort measures and move him to hospice facility.  Patient will go to hospice home today with the bed is available.  Will be continued on Ativan, morphine. Patient also has severe malnutrition.  Seen by dietitian.  Discharge Condition: Stable for transfer to hospice home.       Discharge Instructions  and  Discharge Medications     Allergies as of 11/16/2017      Reactions   Influenza Vaccine Recombinant Hives   Other Hives      Medication List    STOP taking these medications   albuterol 108 (90 Base) MCG/ACT inhaler Commonly known as:  PROVENTIL HFA;VENTOLIN HFA   budesonide-formoterol 160-4.5 MCG/ACT inhaler Commonly known as:  SYMBICORT   ciprofloxacin 500 MG tablet Commonly known as:  CIPRO   FUROSEMIDE PO   guaiFENesin 600 MG 12 hr tablet Commonly known as:  MUCINEX   lactulose (encephalopathy) 10 GM/15ML Soln Commonly known as:  CHRONULAC   Melatonin 3 MG Tabs   multivitamin with minerals tablet   omeprazole 20 MG capsule Commonly known as:  PRILOSEC   polyethylene glycol  packet Commonly known as:  MIRALAX / GLYCOLAX   rifaximin 550 MG Tabs tablet Commonly known as:  XIFAXAN    sildenafil 100 MG tablet Commonly known as:  VIAGRA   spironolactone 50 MG tablet Commonly known as:  ALDACTONE   zinc sulfate 220 (50 Zn) MG capsule     TAKE these medications   LORazepam 2 MG/ML concentrated solution Commonly known as:  ATIVAN Take 0.3 mLs (0.6 mg total) by mouth every 4 (four) hours as needed for anxiety.   morphine CONCENTRATE 10 MG/0.5ML Soln concentrated solution Take 0.5 mLs (10 mg total) by mouth every 2 (two) hours as needed for severe pain.         Diet and Activity recommendation: See Discharge Instructions above   Consults obtained -gastroenterology, intensivist, palliative care team   Major procedures and Radiology Reports - PLEASE review detailed and final reports for all details, in brief -   Dg Abd 1 View  Result Date: 11/14/2017 CLINICAL DATA:  72 year old male with a history of nasogastric tube placement EXAM: ABDOMEN - 1 VIEW COMPARISON:  None. FINDINGS: Limited frontal view of the abdomen performed. No significant airspace disease at the lung bases. Metallic tip enteric tube terminates in the body of the stomach. Gas within stomach, small bowel, colon, without abnormal distention. IMPRESSION: Metallic tip enteric feeding tube terminates in the stomach. Electronically Signed   By: Corrie Mckusick D.O.   On: 11/14/2017 10:35   Ct Head Wo Contrast  Result Date: 11/12/2017 CLINICAL DATA:  Altered level of consciousness, holding the abdomen EXAM: CT HEAD WITHOUT CONTRAST TECHNIQUE: Contiguous axial images were obtained from the base of the skull through the vertex without intravenous contrast. COMPARISON:  CT brain scan of 12/02/2008 FINDINGS: Brain: The ventricular system remains somewhat prominent as are the cortical sulci, indicative of diffuse atrophy. The septum is midline in position. The fourth ventricle and basilar cisterns are stable. No hemorrhage, mass lesion, or acute infarction is seen. Benign-appearing bilateral basal ganglial  calcifications are noted. Vascular: No vascular abnormality is seen on this unenhanced study. Skull: On bone window images, no calvarial abnormality is seen. Sinuses/Orbits: There is only a small amount of mucosal edema and debris within the left partition of the sphenoid sinus. The remainder of the paranasal sinuses are well pneumatized. Other: None. IMPRESSION: 1. Atrophy.  No acute intracranial abnormality. 2. Mild left sphenoid sinus debris and mucosal thickening. Electronically Signed   By: Ivar Drape M.D.   On: 11/12/2017 12:05   US Renal  Result Date: 11/13/2017 CLINICAL DATA:  Abdominal pain. EXAM: RENAL / URINARY TRACT ULTRASOUND COMPLETE COMPARISON:  Abdominal series 11/12/2017.  Ultrasound 06/09/2014. FINDINGS: Right Kidney: Length: 9.7 cm. Mild increased echogenicity. No hydronephrosis. No mass lesion. Left Kidney: Length: 9.5 cm. Mild increased echogenicity. No hydronephrosis. No mass lesion. Bladder: Foley catheter is present.  Bladder is nondistended. Incidental note is made of mild ascites IMPRESSION: 1. Mild increased echogenicity both kidneys. Chronic medical renal disease cannot be excluded. No acute renal abnormality. No hydronephrosis or bladder distention. Foley catheter present. 2.  Incidental note is made of mild ascites. Electronically Signed   By: Marcello Moores  Register   On: 11/13/2017 11:51   US Abdomen Limited  Result Date: 11/13/2017 CLINICAL DATA:  72 year old with cirrhosis and confusion. Request for diagnostic paracentesis. EXAM: LIMITED ABDOMEN ULTRASOUND FOR ASCITES TECHNIQUE: Limited ultrasound survey for ascites was performed in all four abdominal quadrants. COMPARISON:  Renal ultrasound 11/13/2017 FINDINGS: There is a small amount of perihepatic  ascites. However, the patient is very tachypneic and there was not a reliable safe percutaneous window for paracentesis. IMPRESSION: Small amount of perihepatic ascites. No safe percutaneous window due to patient's rapid breathing and  overall condition. Paracentesis not performed. Electronically Signed   By: Markus Daft M.D.   On: 11/13/2017 12:10   Dg Abdomen Acute W/chest  Result Date: 11/12/2017 CLINICAL DATA:  Altered mental status. EXAM: DG ABDOMEN ACUTE W/ 1V CHEST COMPARISON:  12/19/2013. FINDINGS: Mediastinum and hilar structures are normal. Lungs are clear. No pleural effusion or pneumothorax. Solitary symmetric nodular opacities noted over both lung bases consistent with nipple shadows. EKG leads noted over the chest. Heart size normal. No acute bony abnormality. Air-filled loops of small and large bowel are noted suggesting adynamic ileus. Follow-up exam to demonstrate clearing and to exclude bowel obstruction suggested. No free air. Thoracolumbar spine scoliosis. IMPRESSION: 1.  No acute cardiopulmonary disease. 2. Distended loops of small and large bowel suggesting adynamic ileus. Follow-up exam suggested in order to demonstrate resolution and to exclude bowel obstruction. Electronically Signed   By: Marcello Moores  Register   On: 11/12/2017 11:44    Micro Results    Recent Results (from the past 240 hour(s))  MRSA PCR Screening     Status: None   Collection Time: 11/13/17  4:32 PM  Result Value Ref Range Status   MRSA by PCR NEGATIVE NEGATIVE Final    Comment:        The GeneXpert MRSA Assay (FDA approved for NASAL specimens only), is one component of a comprehensive MRSA colonization surveillance program. It is not intended to diagnose MRSA infection nor to guide or monitor treatment for MRSA infections. Performed at [facility], [address], [place], [place] [address]        Today   Subjective:   Richard Wood is still lethargic and stable for discharge to hospice home and arrangements are made.  Objective:   Blood pressure 132/81, pulse (!) 108, temperature 99.9 F (37.7 C), temperature source Axillary, resp. rate 18, height 5' 11"  (1.803 m), weight 48.8 kg (107 lb 8 oz), SpO2 96  %.   Intake/Output Summary (Last 24 hours) at 11/16/2017 1552 Last data filed at 11/16/2017 0518 Gross per 24 hour  Intake 665.33 ml  Output 1325 ml  Net -659.67 ml    Exam Severely malnourished, unresponsive and has NG tube on [place].AT,PERRAL Supple Neck,No JVD, No cervical lymphadenopathy appriciated.  Symmetrical Chest wall movement, Good air movement bilaterally, CTAB RRR,No Gallops,Rubs or new Murmurs, No Parasternal Heave +ve B.Sounds, Abd Soft, Non tender, No organomegaly appriciated, No rebound -guarding or rigidity. No Cyanosis, Clubbing or edema, No new Rash or bruise  Data Review   CBC w Diff:  Lab Results  Component Value Date   WBC 10.2 11/15/2017   HGB 8.9 (L) 11/15/2017   HGB 13.9 06/09/2014   HCT 26.9 (L) 11/15/2017   HCT 42.4 06/09/2014   PLT 86 (L) 11/15/2017   PLT 105 (L) 06/09/2014   LYMPHOPCT 7 11/12/2017   LYMPHOPCT 14.7 12/20/2013   MONOPCT 9 11/12/2017   MONOPCT 1.2 12/20/2013   EOSPCT 1 11/12/2017   EOSPCT 0.0 12/20/2013   BASOPCT 1 11/12/2017   BASOPCT 0.2 12/20/2013    CMP:  Lab Results  Component Value Date   NA 144 11/15/2017   NA 134 (L) 06/09/2014   K 3.3 (L) 11/15/2017   K 4.0 06/09/2014   CL 112 (H) 11/15/2017   CL 102 06/09/2014  CO2 25 11/15/2017   CO2 25 06/09/2014   BUN 48 (H) 11/15/2017   BUN 13 06/09/2014   CREATININE 1.15 11/15/2017   CREATININE 0.99 06/09/2014   PROT 7.1 11/15/2017   PROT 7.5 06/09/2014   ALBUMIN 1.6 (L) 11/15/2017   ALBUMIN 2.9 (L) 06/09/2014   BILITOT 8.5 (H) 11/15/2017   BILITOT 1.2 (H) 06/09/2014   ALKPHOS 170 (H) 11/15/2017   ALKPHOS 152 (H) 06/09/2014   AST 291 (H) 11/15/2017   AST 98 (H) 06/09/2014   ALT 143 (H) 11/15/2017   ALT 109 (H) 06/09/2014  .   Total Time in preparing paper work, data evaluation and todays exam - 12 minutes  Epifanio Lesches M.D on 11/16/2017 at 3:52 PM    Note: This dictation was prepared with Dragon dictation along with smaller phrase technology. Any  transcriptional errors that result from this process are unintentional.

## 2017-11-16 NOTE — Care Management Note (Signed)
Case Management Note  Patient Details  Name: ANIEL HUBBLE MRN: [id number] Date of Birth: 07-Jun-1946  Subjective/Objective:   Admitted to [facility] with the diagnosis of protein calories malnutrition. Lives alone. Maudie Mercury, daughter? [phone]). Papers on chart indicated that Mr. Kruzel was on the [facility] unit at [facility] 10/22/17-11/05/17.  Telephone call to [facility] at [facility]. Mr. Hagger is unresponsive, no family at the bedside.  Poor prognosis.  Palliative consult ordered to discuss goals of care.                  Action/Plan: Will continue to follow for discharge plans   Expected Discharge Date:  11/14/17               Expected Discharge Plan:     In-House Referral:   yes  Discharge planning Services   yes  Post Acute Care Choice:    Choice offered to:     DME Arranged:    DME Agency:     HH Arranged:    HH Agency:     Status of Service:     If discussed at [employer], dates discussed:    Additional Comments:  Shelbie Ammons, RN 11/16/2017, 11:49 AM

## 2017-11-17 LAB — BLOOD GAS, VENOUS
ACID-BASE DEFICIT: 3.5 mmol/L — AB (ref 0.0–2.0)
BICARBONATE: 21.3 mmol/L (ref 20.0–28.0)
PATIENT TEMPERATURE: 37
PCO2 VEN: 36 mmHg — AB (ref 44.0–60.0)
PH VEN: 7.38 (ref 7.250–7.430)

## 2017-12-14 DEATH — deceased

## 2019-04-09 IMAGING — CR DG ABDOMEN ACUTE W/ 1V CHEST
1 series · 3 of 3 positions shown · non-contrast
Comparison: 12/19/2013.

CLINICAL DATA: Altered mental status.

EXAM:
DG ABDOMEN ACUTE W/ 1V CHEST

[Series 1: dg abd acute w/chest · 0.14mm/px · 3 of 3 slices shown]
[im 1/3]
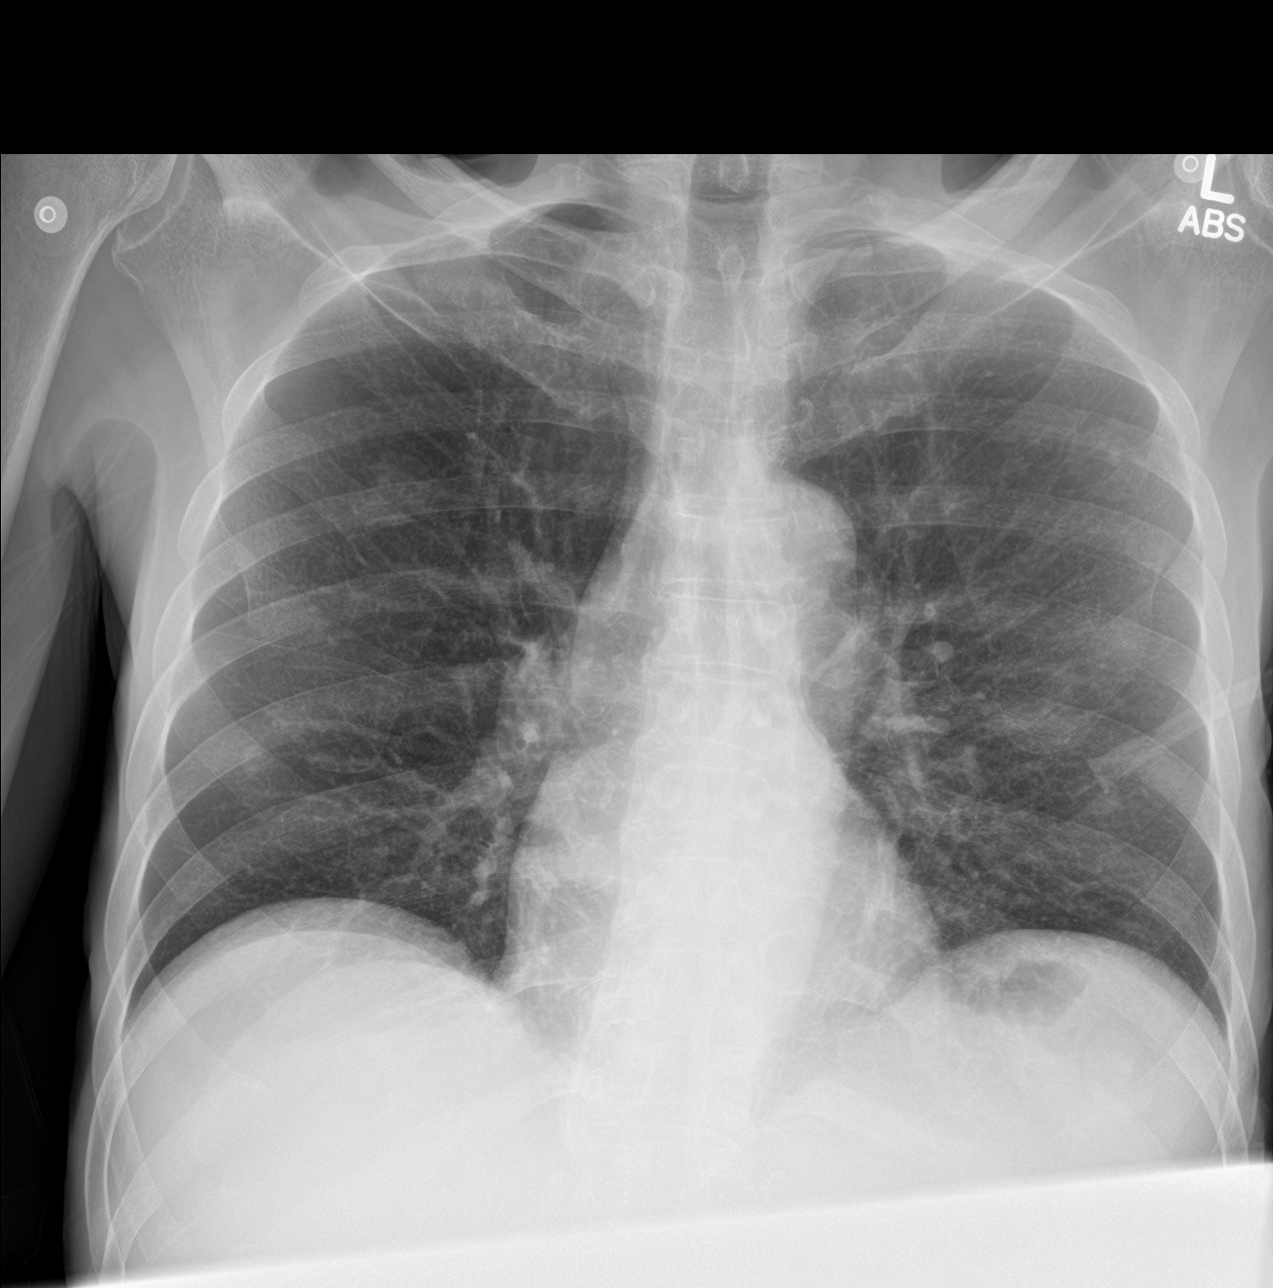
[im 2/3]
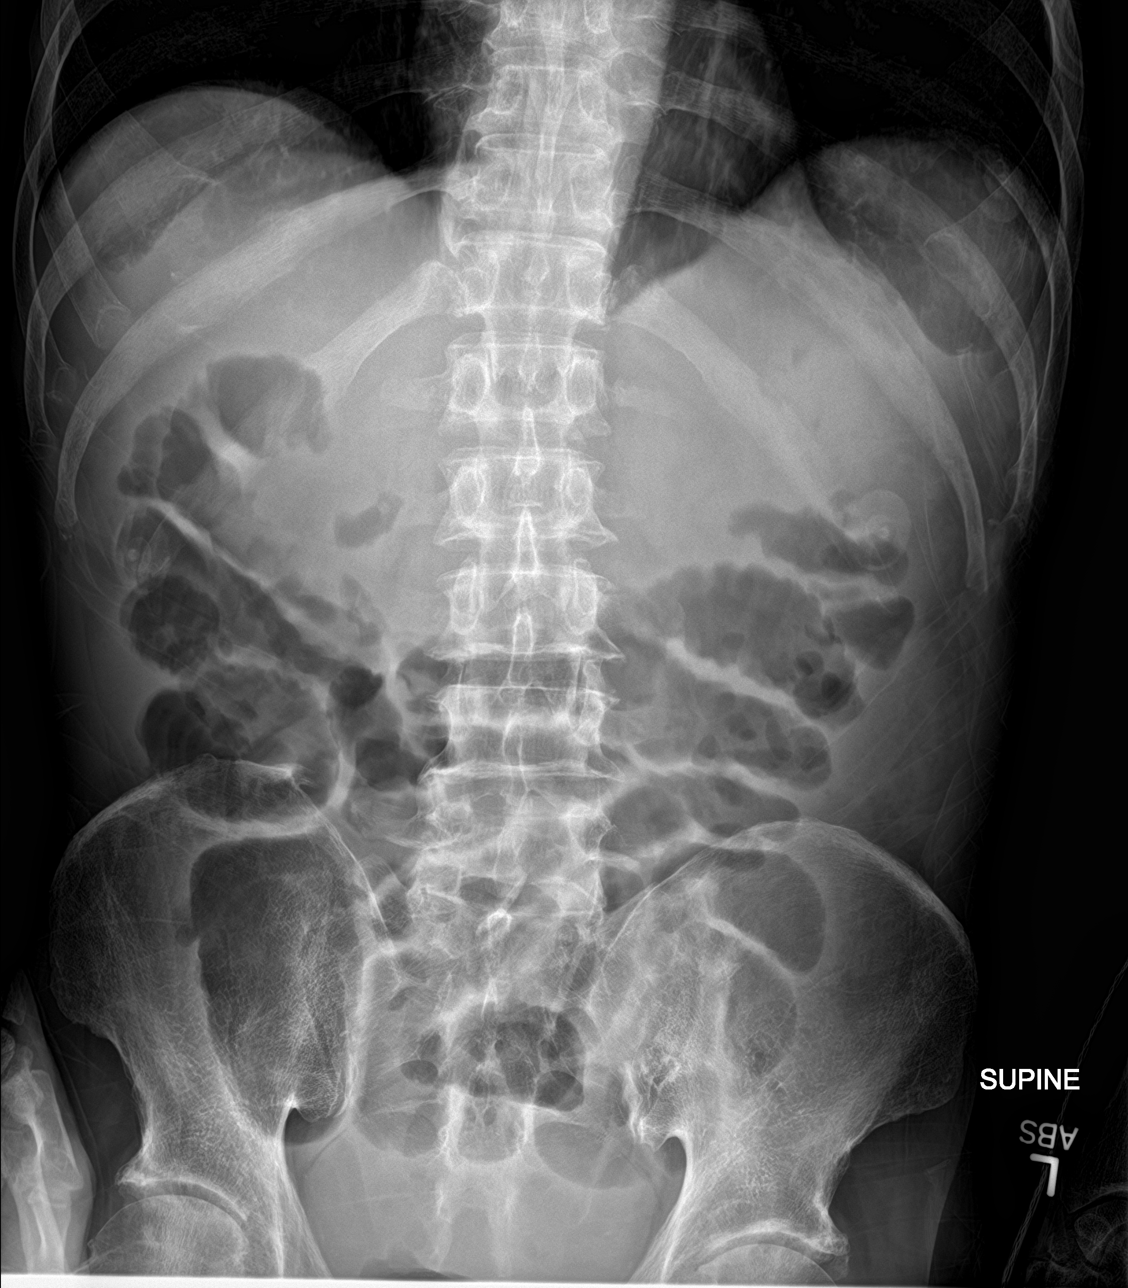
[im 3/3]
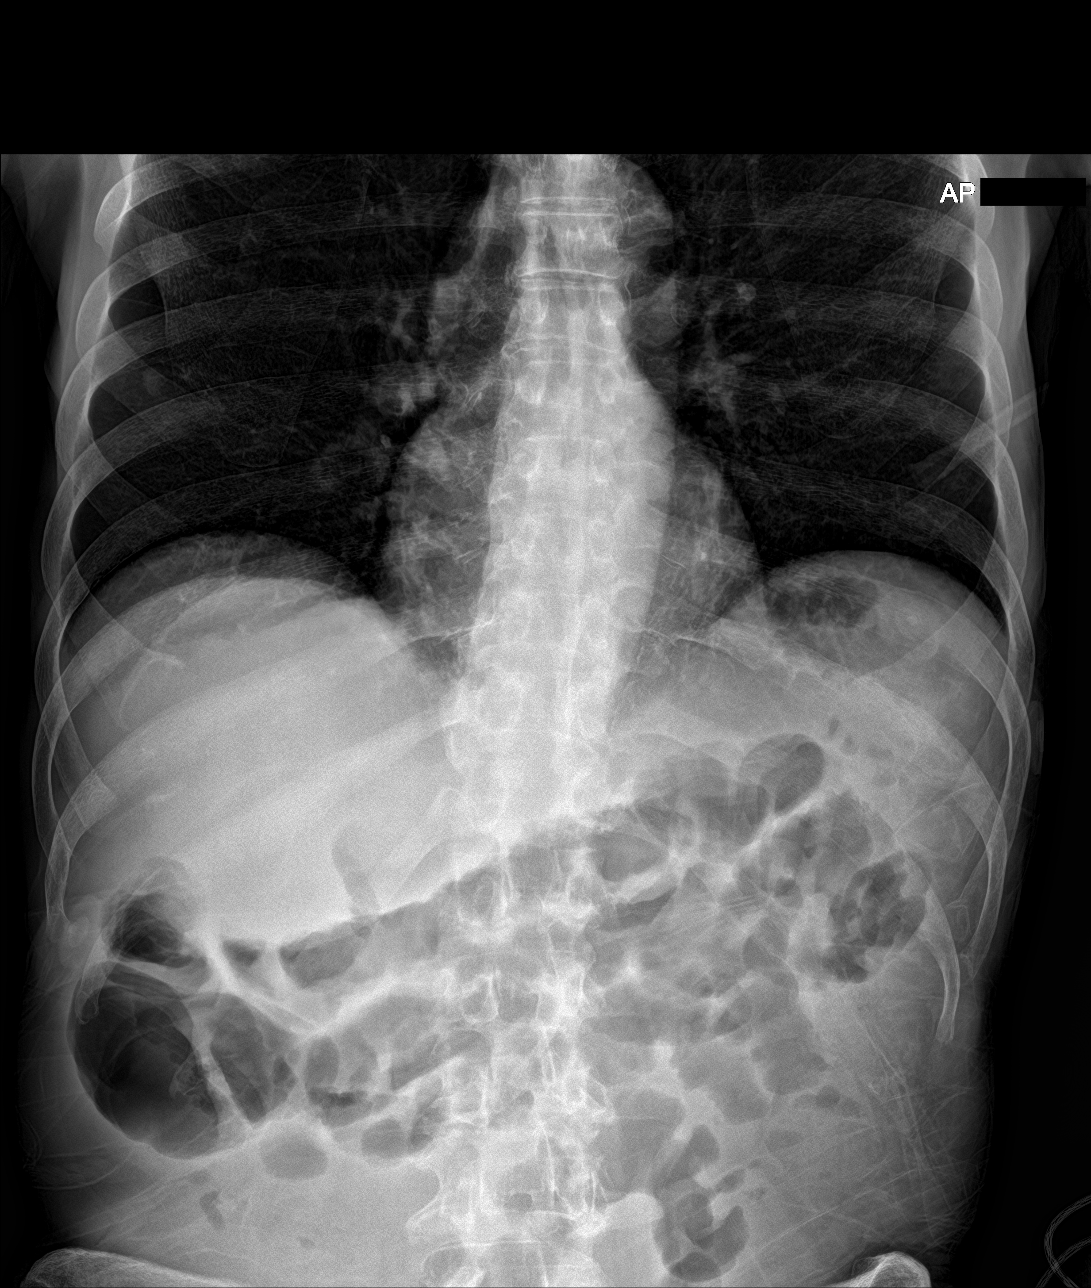

[3 of 3 positions shown; findings below may reference images not displayed]

FINDINGS: Mediastinum and hilar structures are normal. Lungs are clear. No
pleural effusion or pneumothorax. Solitary symmetric nodular
opacities noted over both lung bases consistent with nipple
shadows.. EKG leads noted over the chest. Heart size normal. No
acute bony abnormality.

Air-filled loops of small and large bowel are noted suggesting
adynamic ileus. Follow-up exam to demonstrate clearing and to
exclude bowel obstruction suggested. No free air. Thoracolumbar
spine scoliosis.
IMPRESSION: 1.  No acute cardiopulmonary disease.

2. Distended loops of small and large bowel suggesting adynamic
ileus. Follow-up exam suggested in order to demonstrate resolution
and to exclude bowel obstruction.

## 2019-04-09 IMAGING — CT CT HEAD W/O CM
3 series · 15 of 47 positions shown, 18 images · non-contrast
Comparison: CT brain scan of 12/02/2008

CLINICAL DATA: Altered level of consciousness, holding the abdomen

EXAM:
CT HEAD WITHOUT CONTRAST
TECHNIQUE: Contiguous axial images were obtained from the base of the skull
through the vertex without intravenous contrast.

[Series 2: head wo · axial · 0.47mm/px · z∈[+552,+677]mm · 9 of 30 slices shown, 12 images]
[im 3/30  brain]
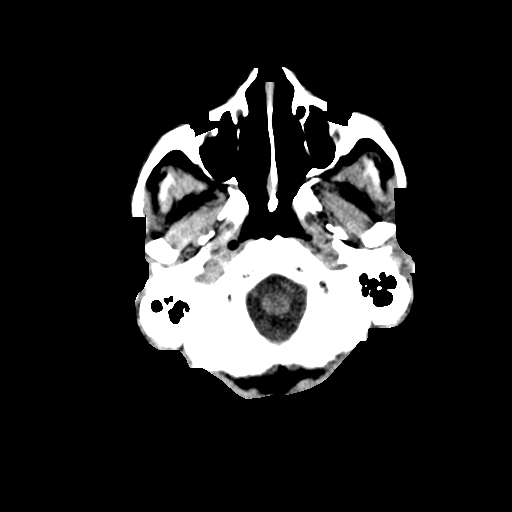
[im 3/30  bone]
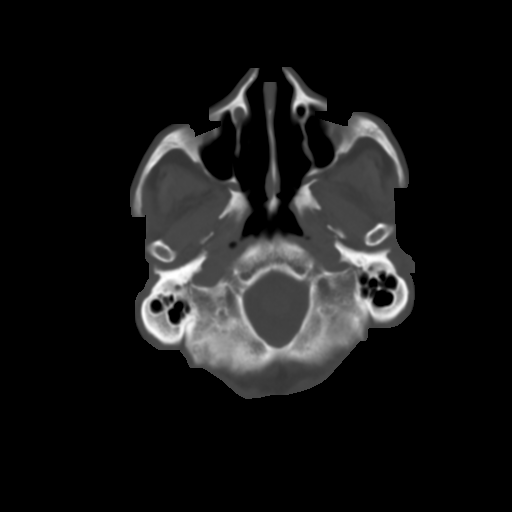
[im 6/30  brain]
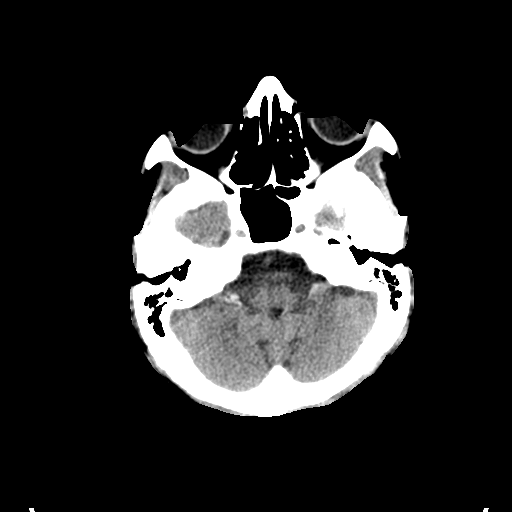
[im 9/30  brain]
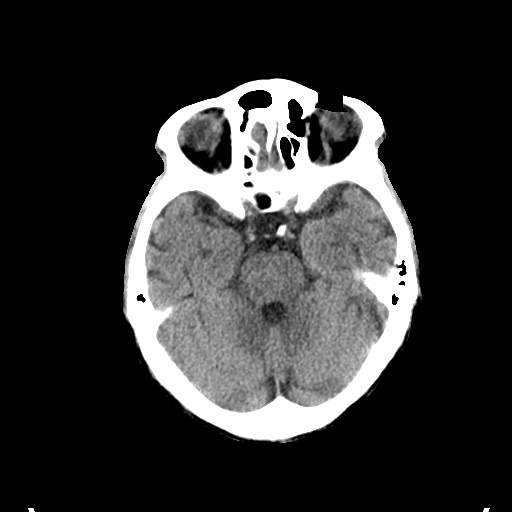
[im 12/30  brain]
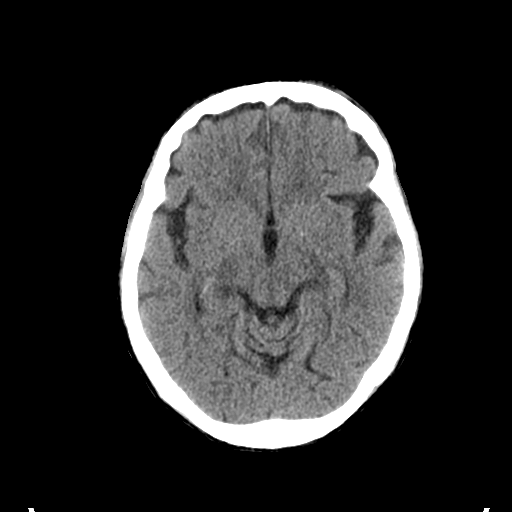
[im 16/30  brain]
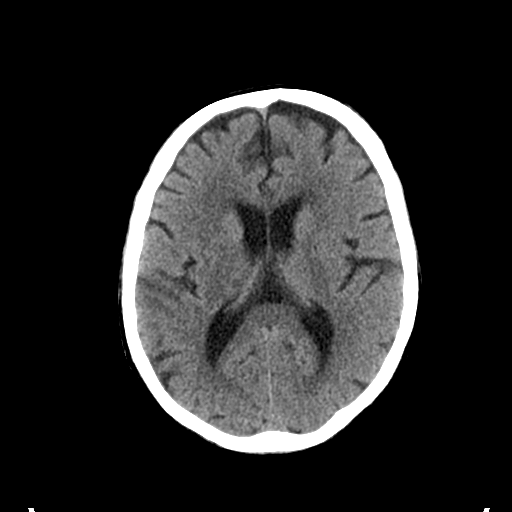
[im 16/30  bone]
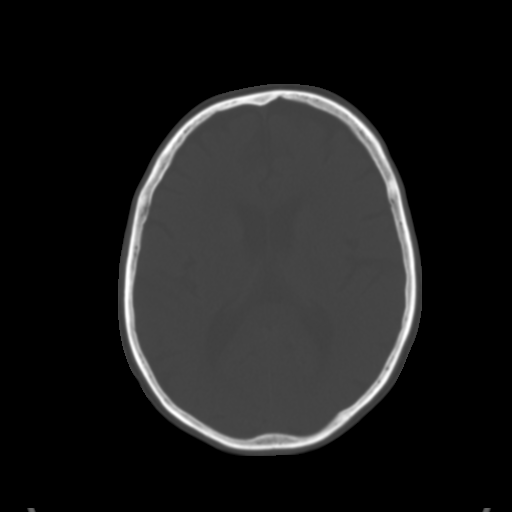
[im 19/30  brain]
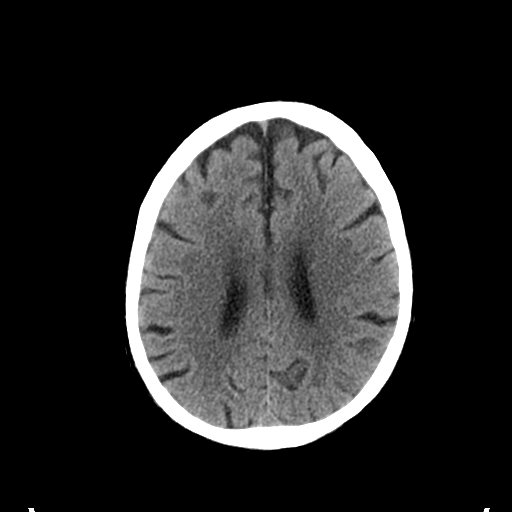
[im 22/30  brain]
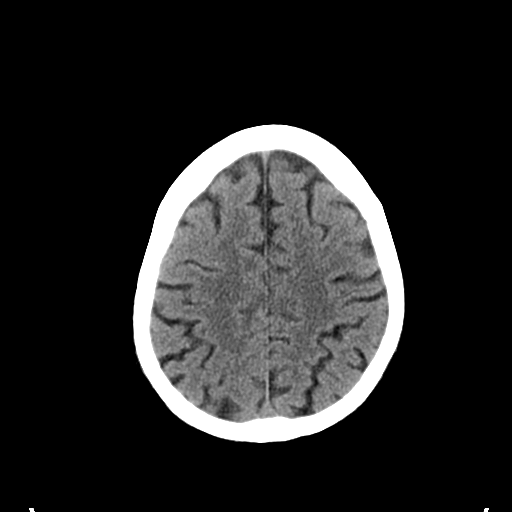
[im 25/30  brain]
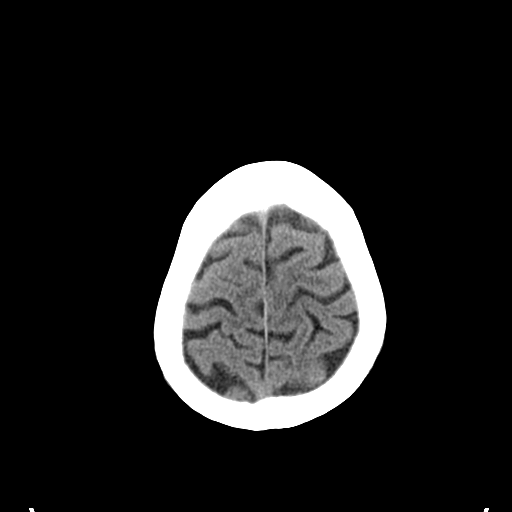
[im 28/30  brain]
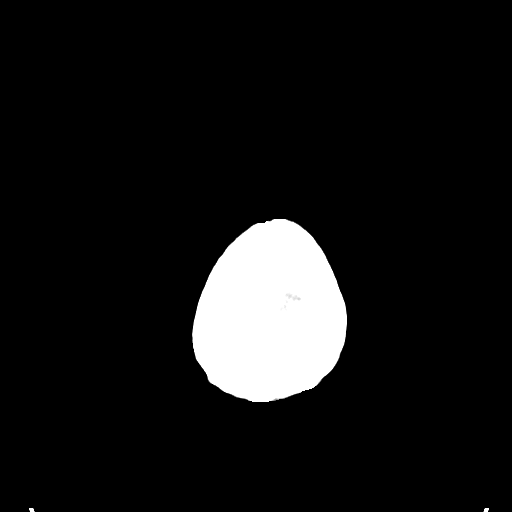
[im 28/30  bone]
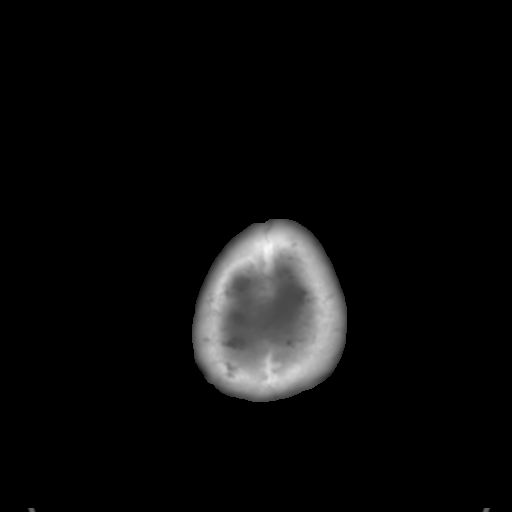

[Series 4: coronal soft tissue · coronal · 0.27mm/px · 3 of 58 slices shown]
[im 20/58  brain]
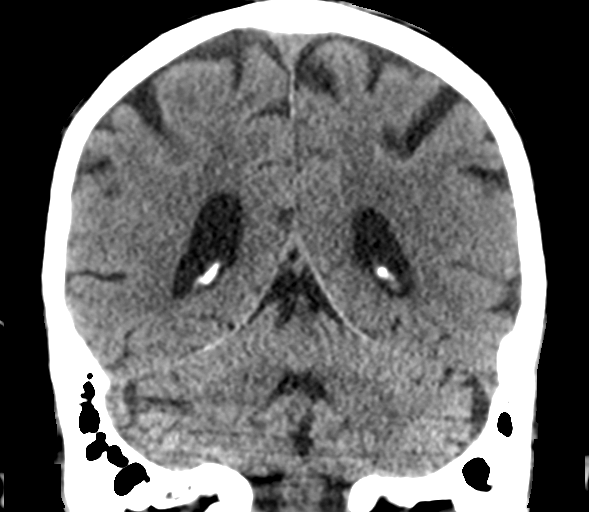
[im 26/58  brain]
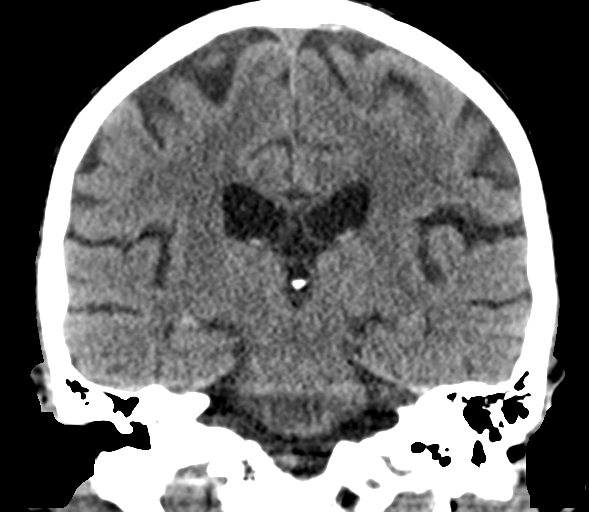
[im 32/58  brain]
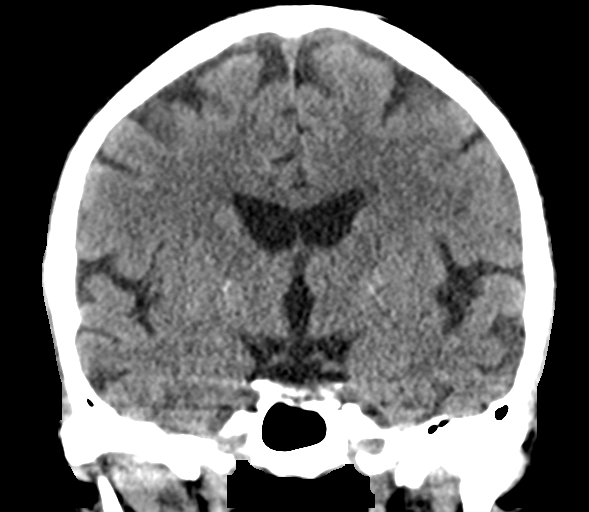

[Series 5: sagittal soft tissue · sagittal · 0.29mm/px · 3 of 50 slices shown]
[im 17/50  brain]
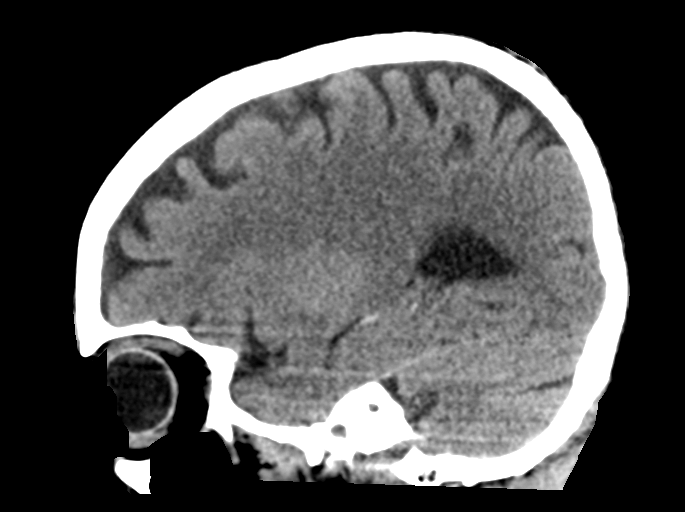
[im 25/50  brain]
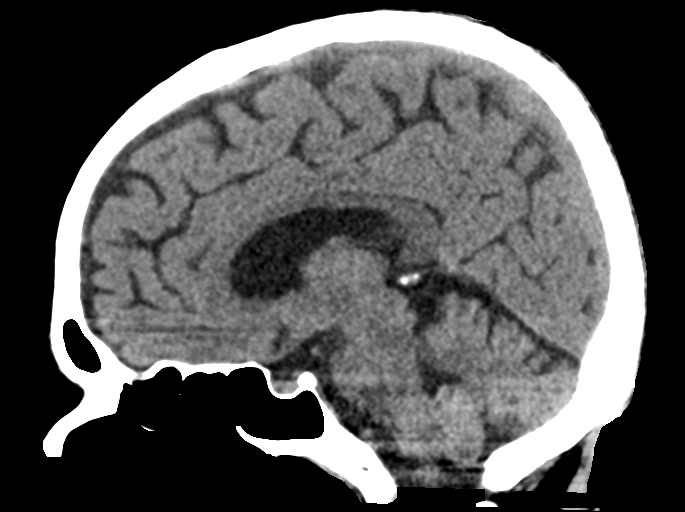
[im 33/50  brain]
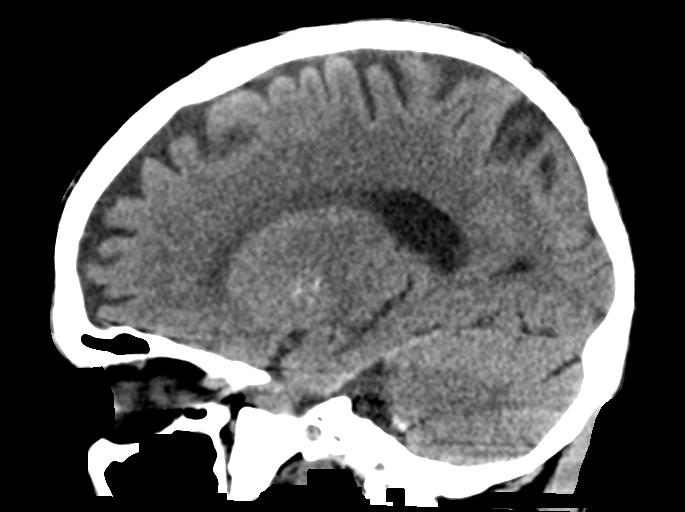

[15 of 47 positions shown; findings below may reference images not displayed]

FINDINGS: Brain: The ventricular system remains somewhat prominent as are the
cortical sulci, indicative of diffuse atrophy. The septum is midline
in position. The fourth ventricle and basilar cisterns are stable.
No hemorrhage, mass lesion, or acute infarction is seen.
Benign-appearing bilateral basal ganglial calcifications are noted.

Vascular: No vascular abnormality is seen on this unenhanced study.

Skull: On bone window images, no calvarial abnormality is seen.

Sinuses/Orbits: There is only a small amount of mucosal edema and
debris within the left partition of the sphenoid sinus. The
remainder of the paranasal sinuses are well pneumatized.

Other: None.
IMPRESSION: 1. Atrophy.  No acute intracranial abnormality.
2. Mild left sphenoid sinus debris and mucosal thickening.
